# Patient Record
Sex: Male | Born: 1950 | Race: White | Hispanic: No | Marital: Married | Smoking: Never smoker
Health system: Southern US, Community
[De-identification: ages and names within clinical notes are randomized; demographics above are authoritative.]

## PROBLEM LIST (undated history)

## (undated) HISTORY — PX: SHOULDER SURGERY: SHX246

---

## 2004-03-10 ENCOUNTER — Encounter (INDEPENDENT_AMBULATORY_CARE_PROVIDER_SITE_OTHER): Payer: Self-pay | Admitting: Family Medicine

## 2004-11-25 ENCOUNTER — Ambulatory Visit: Payer: Self-pay | Admitting: Ophthalmology

## 2005-01-30 ENCOUNTER — Encounter (INDEPENDENT_AMBULATORY_CARE_PROVIDER_SITE_OTHER): Payer: Self-pay | Admitting: Family Medicine

## 2005-01-30 LAB — CONVERTED CEMR LAB
Blood Glucose, Fasting: 97 mg/dL
PSA: 0.39 ng/mL
WBC, blood: 6 10*3/uL

## 2005-07-21 ENCOUNTER — Ambulatory Visit: Payer: Self-pay | Admitting: Family Medicine

## 2005-08-03 ENCOUNTER — Ambulatory Visit (HOSPITAL_COMMUNITY): Admission: RE | Admit: 2005-08-03 | Discharge: 2005-08-03 | Payer: Self-pay | Admitting: Family Medicine

## 2005-08-04 ENCOUNTER — Ambulatory Visit: Payer: Self-pay | Admitting: Family Medicine

## 2005-11-03 ENCOUNTER — Ambulatory Visit: Payer: Self-pay | Admitting: Family Medicine

## 2006-03-06 ENCOUNTER — Encounter: Payer: Self-pay | Admitting: Family Medicine

## 2006-03-06 DIAGNOSIS — E785 Hyperlipidemia, unspecified: Secondary | ICD-10-CM | POA: Insufficient documentation

## 2006-03-06 DIAGNOSIS — M545 Low back pain: Secondary | ICD-10-CM

## 2006-03-06 DIAGNOSIS — Z86718 Personal history of other venous thrombosis and embolism: Secondary | ICD-10-CM

## 2006-09-17 ENCOUNTER — Ambulatory Visit: Payer: Self-pay | Admitting: Family Medicine

## 2006-09-17 LAB — CONVERTED CEMR LAB
Cholesterol, target level: 200 mg/dL
HDL goal, serum: 40 mg/dL

## 2006-09-18 ENCOUNTER — Encounter (INDEPENDENT_AMBULATORY_CARE_PROVIDER_SITE_OTHER): Payer: Self-pay | Admitting: Family Medicine

## 2006-09-18 LAB — CONVERTED CEMR LAB
Albumin: 4.5 g/dL (ref 3.5–5.2)
BUN: 18 mg/dL (ref 6–23)
Calcium: 9.5 mg/dL (ref 8.4–10.5)
LDL Cholesterol: 183 mg/dL — ABNORMAL HIGH (ref 0–99)
Sodium: 141 meq/L (ref 135–145)
Total CHOL/HDL Ratio: 5.9
Total Protein: 7.2 g/dL (ref 6.0–8.3)
VLDL: 36 mg/dL (ref 0–40)

## 2006-09-20 ENCOUNTER — Telehealth (INDEPENDENT_AMBULATORY_CARE_PROVIDER_SITE_OTHER): Payer: Self-pay | Admitting: Family Medicine

## 2006-09-30 ENCOUNTER — Telehealth (INDEPENDENT_AMBULATORY_CARE_PROVIDER_SITE_OTHER): Payer: Self-pay | Admitting: Family Medicine

## 2007-03-10 ENCOUNTER — Ambulatory Visit: Payer: Self-pay | Admitting: Family Medicine

## 2007-03-10 DIAGNOSIS — E669 Obesity, unspecified: Secondary | ICD-10-CM | POA: Insufficient documentation

## 2007-03-11 ENCOUNTER — Telehealth (INDEPENDENT_AMBULATORY_CARE_PROVIDER_SITE_OTHER): Payer: Self-pay | Admitting: *Deleted

## 2007-03-11 LAB — CONVERTED CEMR LAB
ALT: 22 units/L (ref 0–53)
AST: 19 units/L (ref 0–37)
Albumin: 4.5 g/dL (ref 3.5–5.2)
Alkaline Phosphatase: 35 units/L — ABNORMAL LOW (ref 39–117)
Basophils Absolute: 0 10*3/uL (ref 0.0–0.1)
Chloride: 104 meq/L (ref 96–112)
Cholesterol: 231 mg/dL — ABNORMAL HIGH (ref 0–200)
Eosinophils Absolute: 0.1 10*3/uL — ABNORMAL LOW (ref 0.2–0.7)
Eosinophils Relative: 2 % (ref 0–5)
Glucose, Bld: 110 mg/dL — ABNORMAL HIGH (ref 70–99)
HCT: 46.3 % (ref 39.0–52.0)
Lymphocytes Relative: 29 % (ref 12–46)
Lymphs Abs: 1.5 10*3/uL (ref 0.7–4.0)
MCHC: 33.7 g/dL (ref 30.0–36.0)
Monocytes Absolute: 0.6 10*3/uL (ref 0.1–1.0)
Neutro Abs: 3 10*3/uL (ref 1.7–7.7)
RBC: 5.09 M/uL (ref 4.22–5.81)
RDW: 12.9 % (ref 11.5–15.5)
Sodium: 139 meq/L (ref 135–145)
TSH: 2.192 microintl units/mL (ref 0.350–5.50)
Total Bilirubin: 0.7 mg/dL (ref 0.3–1.2)
Total Protein: 7.3 g/dL (ref 6.0–8.3)
Triglycerides: 178 mg/dL — ABNORMAL HIGH (ref <150)
WBC: 5.1 10*3/uL (ref 4.0–10.5)

## 2008-01-20 ENCOUNTER — Ambulatory Visit: Payer: Self-pay | Admitting: Family Medicine

## 2008-01-20 LAB — CONVERTED CEMR LAB

## 2008-01-21 ENCOUNTER — Encounter (INDEPENDENT_AMBULATORY_CARE_PROVIDER_SITE_OTHER): Payer: Self-pay | Admitting: Family Medicine

## 2008-01-21 LAB — CONVERTED CEMR LAB
AST: 41 units/L — ABNORMAL HIGH (ref 0–37)
Albumin: 4.4 g/dL (ref 3.5–5.2)
Alkaline Phosphatase: 27 units/L — ABNORMAL LOW (ref 39–117)
BUN: 22 mg/dL (ref 6–23)
Basophils Relative: 1 % (ref 0–1)
Calcium: 9.2 mg/dL (ref 8.4–10.5)
Eosinophils Absolute: 0.1 10*3/uL (ref 0.0–0.7)
Eosinophils Relative: 2 % (ref 0–5)
Glucose, Bld: 111 mg/dL — ABNORMAL HIGH (ref 70–99)
HDL: 48 mg/dL (ref >39)
LDL Cholesterol: 162 mg/dL — ABNORMAL HIGH (ref 0–99)
Lymphs Abs: 1.2 10*3/uL (ref 0.7–4.0)
MCV: 93.3 fL (ref 78.0–100.0)
Monocytes Relative: 12 % (ref 3–12)
Potassium: 5.1 meq/L (ref 3.5–5.3)
RDW: 13 % (ref 11.5–15.5)
Sodium: 138 meq/L (ref 135–145)
TSH: 1.757 microintl units/mL (ref 0.350–4.50)
Total CHOL/HDL Ratio: 4.9
Total Protein: 7.2 g/dL (ref 6.0–8.3)
VLDL: 26 mg/dL (ref 0–40)

## 2008-01-25 ENCOUNTER — Telehealth (INDEPENDENT_AMBULATORY_CARE_PROVIDER_SITE_OTHER): Payer: Self-pay | Admitting: *Deleted

## 2008-01-25 ENCOUNTER — Encounter (INDEPENDENT_AMBULATORY_CARE_PROVIDER_SITE_OTHER): Payer: Self-pay | Admitting: Family Medicine

## 2012-01-25 ENCOUNTER — Telehealth: Payer: Self-pay

## 2012-01-25 NOTE — Telephone Encounter (Signed)
Pt received letter to schedule screening colonoscopy. ( He was referred by Dr. Dwana Melena). He said he checked with his insurance and they will dover better if he has it done in an office setting. I told him the [facility] in [place] did that. I encouraged him to make sure he schedules. I am sending PCP letter, since pt declined to schedule here due to needing to do in office setting.

## 2012-12-14 ENCOUNTER — Ambulatory Visit (HOSPITAL_COMMUNITY)
Admission: RE | Admit: 2012-12-14 | Discharge: 2012-12-14 | Disposition: A | Discharge status: Home / Self Care | Payer: BC Managed Care – PPO | Source: Ambulatory Visit | Attending: Internal Medicine | Admitting: Internal Medicine

## 2012-12-14 ENCOUNTER — Other Ambulatory Visit (HOSPITAL_COMMUNITY): Payer: Self-pay | Admitting: Internal Medicine

## 2012-12-14 DIAGNOSIS — R52 Pain, unspecified: Secondary | ICD-10-CM

## 2012-12-14 DIAGNOSIS — M545 Low back pain, unspecified: Secondary | ICD-10-CM | POA: Insufficient documentation

## 2012-12-14 DIAGNOSIS — M549 Dorsalgia, unspecified: Secondary | ICD-10-CM

## 2012-12-14 DIAGNOSIS — M79605 Pain in left leg: Secondary | ICD-10-CM

## 2012-12-14 DIAGNOSIS — M5126 Other intervertebral disc displacement, lumbar region: Secondary | ICD-10-CM | POA: Insufficient documentation

## 2014-08-08 ENCOUNTER — Emergency Department (HOSPITAL_COMMUNITY): Payer: 59

## 2014-08-08 ENCOUNTER — Encounter (HOSPITAL_COMMUNITY): Payer: Self-pay | Admitting: Emergency Medicine

## 2014-08-08 ENCOUNTER — Inpatient Hospital Stay (HOSPITAL_COMMUNITY)
Admission: EM | Admit: 2014-08-08 | Discharge: 2014-08-11 | DRG: 690 | Disposition: A | Discharge status: Home / Self Care | Payer: 59 | Attending: Internal Medicine | Admitting: Internal Medicine

## 2014-08-08 DIAGNOSIS — K5900 Constipation, unspecified: Secondary | ICD-10-CM | POA: Diagnosis present

## 2014-08-08 DIAGNOSIS — B9620 Unspecified Escherichia coli [E. coli] as the cause of diseases classified elsewhere: Secondary | ICD-10-CM | POA: Diagnosis present

## 2014-08-08 DIAGNOSIS — N10 Acute tubulo-interstitial nephritis: Secondary | ICD-10-CM | POA: Diagnosis not present

## 2014-08-08 DIAGNOSIS — E785 Hyperlipidemia, unspecified: Secondary | ICD-10-CM | POA: Diagnosis present

## 2014-08-08 DIAGNOSIS — N200 Calculus of kidney: Secondary | ICD-10-CM

## 2014-08-08 DIAGNOSIS — N12 Tubulo-interstitial nephritis, not specified as acute or chronic: Secondary | ICD-10-CM | POA: Diagnosis present

## 2014-08-08 DIAGNOSIS — R339 Retention of urine, unspecified: Secondary | ICD-10-CM

## 2014-08-08 DIAGNOSIS — E871 Hypo-osmolality and hyponatremia: Secondary | ICD-10-CM | POA: Diagnosis present

## 2014-08-08 DIAGNOSIS — N401 Enlarged prostate with lower urinary tract symptoms: Secondary | ICD-10-CM | POA: Diagnosis present

## 2014-08-08 DIAGNOSIS — D696 Thrombocytopenia, unspecified: Secondary | ICD-10-CM | POA: Diagnosis present

## 2014-08-08 DIAGNOSIS — R338 Other retention of urine: Secondary | ICD-10-CM | POA: Diagnosis present

## 2014-08-08 DIAGNOSIS — R509 Fever, unspecified: Secondary | ICD-10-CM | POA: Diagnosis not present

## 2014-08-08 LAB — CBC WITH DIFFERENTIAL/PLATELET
BASOS ABS: 0 10*3/uL (ref 0.0–0.1)
BASOS PCT: 0 % (ref 0–1)
EOS ABS: 0 10*3/uL (ref 0.0–0.7)
EOS PCT: 0 % (ref 0–5)
HEMATOCRIT: 44.8 % (ref 39.0–52.0)
Hemoglobin: 15.2 g/dL (ref 13.0–17.0)
Lymphocytes Relative: 5 % — ABNORMAL LOW (ref 12–46)
Lymphs Abs: 0.8 10*3/uL (ref 0.7–4.0)
MCH: 31.1 pg (ref 26.0–34.0)
MCHC: 33.9 g/dL (ref 30.0–36.0)
MCV: 91.6 fL (ref 78.0–100.0)
MONO ABS: 2.2 10*3/uL — AB (ref 0.1–1.0)
Monocytes Relative: 12 % (ref 3–12)
NEUTROS ABS: 14.8 10*3/uL — AB (ref 1.7–7.7)
Neutrophils Relative %: 83 % — ABNORMAL HIGH (ref 43–77)
Platelets: 145 10*3/uL — ABNORMAL LOW (ref 150–400)
RBC: 4.89 MIL/uL (ref 4.22–5.81)
RDW: 13 % (ref 11.5–15.5)
WBC: 17.8 10*3/uL — ABNORMAL HIGH (ref 4.0–10.5)

## 2014-08-08 NOTE — ED Provider Notes (Signed)
CSN: [id number]     Arrival date & time 08/08/14  2213 History  This chart was scribed for Troy Chaney Troy Licciardi, MD by Abel PrestoKara Demonbreun, ED Scribe. This patient was seen in room APA15/APA15 and the patient's care was started at 11:14 PM.    Chief Complaint  Patient presents with  . Urinary Retention     The history is provided by the patient. No language interpreter was used.   HPI Comments: Troy RakesRobert J Chaney is a 64 y.o. male who presents to the Emergency Department complaining of urinary retention progressive over the last several days.  Patient reports that he is only dribbling. Pt notes associated fever of 101.7 yesterday, constipation and 10/10 low abdominal pressure. Pt states last normal void was 2 days ago. Pt states he defacated some around 9 PM but states "it wasn't much". Pt took a laxative 2 days ago with no relief. Pt denies vomiting, cough, and chest pain. Denies any back pain.  History reviewed. No pertinent past medical history. Past Surgical History  Procedure Laterality Date  . Shoulder surgery     No family history on file. History  Substance Use Topics  . Smoking status: Never Smoker   . Smokeless tobacco: Not on file  . Alcohol Use: Yes    Review of Systems  Constitutional: Positive for fever.  Respiratory: Negative.  Negative for chest tightness and shortness of breath.   Cardiovascular: Negative.  Negative for chest pain.  Gastrointestinal: Positive for abdominal pain and constipation. Negative for nausea, vomiting and diarrhea.  Genitourinary: Positive for dysuria and decreased urine volume. Negative for flank pain.  Musculoskeletal: Negative for back pain.  Neurological: Negative for headaches.  All other systems reviewed and are negative.     Allergies  Review of patient's allergies indicates not on file.  Home Medications   Prior to Admission medications   Not on File   BP 132/78 mmHg  Pulse 107  Temp(Src) 99.1 Chaney (37.3 C) (Oral)  Resp 20   Ht 6\' 2"  (1.88 m)  Wt 260 lb (117.935 kg)  BMI 33.37 kg/m2  SpO2 95% Physical Exam  Constitutional: He is oriented to person, place, and time. He appears well-developed and well-nourished. No distress.  HENT:  Head: Normocephalic and atraumatic.  Cardiovascular: Normal rate, regular rhythm and normal heart sounds.   No murmur heard. Pulmonary/Chest: Effort normal and breath sounds normal. No respiratory distress. He has no wheezes.  Abdominal: Soft. Bowel sounds are normal. He exhibits no distension and no mass. There is tenderness. There is no rebound and no guarding.  Tenderness to palpation over the suprapubic region without rebound or guarding  Genitourinary: Penis normal.  Circumcised penis, no masses noted in the scrotum  Musculoskeletal: He exhibits no edema.  Neurological: He is alert and oriented to person, place, and time.  Skin: Skin is warm and dry.  Psychiatric: He has a normal mood and affect.  Nursing note and vitals reviewed.   ED Course  Procedures (including critical care time) DIAGNOSTIC STUDIES: Oxygen Saturation is 95% on room air, normal by my interpretation.    COORDINATION OF CARE: 11:20 PM Discussed treatment plan with patient at beside including possible catheter placement, the patient agrees with the plan and has no further questions at this time.   Labs Review Labs Reviewed  URINALYSIS, ROUTINE W REFLEX MICROSCOPIC - Abnormal; Notable for the following:    APPearance HAZY (*)    Hgb urine dipstick LARGE (*)    Ketones, ur 15 (*)  Protein, ur 100 (*)    Urobilinogen, UA 2.0 (*)    Nitrite POSITIVE (*)    Leukocytes, UA TRACE (*)    All other components within normal limits  CBC WITH DIFFERENTIAL/PLATELET - Abnormal; Notable for the following:    WBC 17.8 (*)    Platelets 145 (*)    Neutrophils Relative % 83 (*)    Neutro Abs 14.8 (*)    Lymphocytes Relative 5 (*)    Monocytes Absolute 2.2 (*)    All other components within normal limits   BASIC METABOLIC PANEL - Abnormal; Notable for the following:    Sodium 130 (*)    Chloride 96 (*)    Glucose, Bld 135 (*)    All other components within normal limits  URINE MICROSCOPIC-ADD ON - Abnormal; Notable for the following:    Bacteria, UA MANY (*)    All other components within normal limits  URINE CULTURE  CULTURE, BLOOD (ROUTINE X 2)  CULTURE, BLOOD (ROUTINE X 2)    Imaging Review Dg Abd 1 View  08/09/2014   CLINICAL DATA:  Urinary retention onset this morning. Fever. Constipation. Lower abdominal pressure.  EXAM: ABDOMEN - 1 VIEW  COMPARISON:  None.  FINDINGS: No evidence of free intra-abdominal air. Increased air throughout normal caliber small bowel loops in the central abdomen. No significant bowel dilatation. Small volume of colonic stool. No radiopaque calculi. Small calcification in the left pelvis appears vascular. Degenerative change in the lumbar spine.  IMPRESSION: Increased air throughout normal caliber small bowel loops in the central abdomen, may reflect enteritis or ileus.   Electronically Signed   By: Rubye OaksMelanie  Ehinger M.D.   On: 08/09/2014 00:38     EKG Interpretation None      MDM   Final diagnoses:  Pyelonephritis  Urinary retention   Patient presents with urinary retention, fever, abdominal pain, and constipation. Nontoxic on exam. Afebrile with a temperature of 99.1.  Uncomfortable appearing. Foley catheter placed for urinary retention. 300 mL obtained. Urinalysis and urine culture sent. Basic labwork obtained given recent fever. Notable for leukocytosis with left shift. Urinalysis is nitrite positive. Patient also has evidence of mild hyponatremia. Given a normal saline bolus. Blood cultures added and patient given IV Rocephin for presumed pyelonephritis given leukocytosis, fever, and nitrite-positive urine. KUB shows enteritis versus ileus. Favor ileus secondary to urinary tract infection. Abdomen is soft.  Given patient age and evidence of systemic  infection with fever and leukocytosis, will admit for IV antibiotics.   I personally performed the services described in this documentation, which was scribed in my presence. The recorded information has been reviewed and is accurate.     Troy Chaney Sayaka Hoeppner, MD 08/09/14 [phone]

## 2014-08-08 NOTE — ED Notes (Signed)
Pt c/o lower abd/penis pain and states she stomach is bloated.

## 2014-08-09 ENCOUNTER — Inpatient Hospital Stay (HOSPITAL_COMMUNITY): Payer: 59

## 2014-08-09 DIAGNOSIS — N401 Enlarged prostate with lower urinary tract symptoms: Secondary | ICD-10-CM | POA: Diagnosis present

## 2014-08-09 DIAGNOSIS — N12 Tubulo-interstitial nephritis, not specified as acute or chronic: Secondary | ICD-10-CM

## 2014-08-09 DIAGNOSIS — R338 Other retention of urine: Secondary | ICD-10-CM | POA: Diagnosis present

## 2014-08-09 DIAGNOSIS — K5900 Constipation, unspecified: Secondary | ICD-10-CM | POA: Diagnosis present

## 2014-08-09 DIAGNOSIS — R339 Retention of urine, unspecified: Secondary | ICD-10-CM | POA: Diagnosis not present

## 2014-08-09 DIAGNOSIS — B9620 Unspecified Escherichia coli [E. coli] as the cause of diseases classified elsewhere: Secondary | ICD-10-CM | POA: Diagnosis present

## 2014-08-09 DIAGNOSIS — E785 Hyperlipidemia, unspecified: Secondary | ICD-10-CM | POA: Diagnosis present

## 2014-08-09 DIAGNOSIS — R509 Fever, unspecified: Secondary | ICD-10-CM | POA: Diagnosis present

## 2014-08-09 DIAGNOSIS — E871 Hypo-osmolality and hyponatremia: Secondary | ICD-10-CM | POA: Diagnosis present

## 2014-08-09 DIAGNOSIS — N10 Acute tubulo-interstitial nephritis: Secondary | ICD-10-CM | POA: Diagnosis present

## 2014-08-09 DIAGNOSIS — D696 Thrombocytopenia, unspecified: Secondary | ICD-10-CM | POA: Diagnosis present

## 2014-08-09 LAB — PSA: PSA: 32.5 ng/mL — ABNORMAL HIGH (ref 0.00–4.00)

## 2014-08-09 LAB — BASIC METABOLIC PANEL
ANION GAP: 10 (ref 5–15)
Anion gap: 7 (ref 5–15)
BUN: 12 mg/dL (ref 6–20)
BUN: 15 mg/dL (ref 6–20)
CALCIUM: 8.9 mg/dL (ref 8.9–10.3)
CHLORIDE: 101 mmol/L (ref 101–111)
CHLORIDE: 96 mmol/L — AB (ref 101–111)
CO2: 24 mmol/L (ref 22–32)
CO2: 25 mmol/L (ref 22–32)
CREATININE: 1.01 mg/dL (ref 0.61–1.24)
Calcium: 8.8 mg/dL — ABNORMAL LOW (ref 8.9–10.3)
Creatinine, Ser: 0.85 mg/dL (ref 0.61–1.24)
GFR calc Af Amer: >60 mL/min (ref >60)
GLUCOSE: 132 mg/dL — AB (ref 65–99)
Glucose, Bld: 135 mg/dL — ABNORMAL HIGH (ref 65–99)
Potassium: 3.6 mmol/L (ref 3.5–5.1)
Potassium: 4.1 mmol/L (ref 3.5–5.1)
Sodium: 130 mmol/L — ABNORMAL LOW (ref 135–145)
Sodium: 133 mmol/L — ABNORMAL LOW (ref 135–145)

## 2014-08-09 LAB — URINALYSIS, ROUTINE W REFLEX MICROSCOPIC
BILIRUBIN URINE: NEGATIVE
Glucose, UA: NEGATIVE mg/dL
Ketones, ur: 15 mg/dL — AB
NITRITE: POSITIVE — AB
PH: 6 (ref 5.0–8.0)
Protein, ur: 100 mg/dL — AB
SPECIFIC GRAVITY, URINE: 1.025 (ref 1.005–1.030)
Urobilinogen, UA: 2 mg/dL — ABNORMAL HIGH (ref 0.0–1.0)

## 2014-08-09 LAB — CBC
HCT: 41.5 % (ref 39.0–52.0)
Hemoglobin: 13.7 g/dL (ref 13.0–17.0)
MCH: 30.4 pg (ref 26.0–34.0)
MCHC: 33 g/dL (ref 30.0–36.0)
MCV: 92.2 fL (ref 78.0–100.0)
Platelets: 157 10*3/uL (ref 150–400)
RBC: 4.5 MIL/uL (ref 4.22–5.81)
RDW: 13.2 % (ref 11.5–15.5)
WBC: 16.4 10*3/uL — ABNORMAL HIGH (ref 4.0–10.5)

## 2014-08-09 LAB — NA AND K (SODIUM & POTASSIUM), RAND UR
POTASSIUM UR: 11 mmol/L
Sodium, Ur: 25 mmol/L

## 2014-08-09 LAB — URINE MICROSCOPIC-ADD ON

## 2014-08-09 MED ORDER — ENOXAPARIN SODIUM 40 MG/0.4ML ~~LOC~~ SOLN
40.0000 mg | SUBCUTANEOUS | Status: DC
  Administered 2014-08-09 – 2014-08-11 (×3): 40 mg via SUBCUTANEOUS
  Filled 2014-08-09 (×3): qty 0.4

## 2014-08-09 MED ORDER — CEFTRIAXONE SODIUM IN DEXTROSE 20 MG/ML IV SOLN
1.0000 g | INTRAVENOUS | Status: DC
  Filled 2014-08-09: qty 50

## 2014-08-09 MED ORDER — SODIUM CHLORIDE 0.9 % IV BOLUS (SEPSIS)
1000.0000 mL | Freq: Once | INTRAVENOUS | Status: AC
  Administered 2014-08-09: 2000 mL via INTRAVENOUS

## 2014-08-09 MED ORDER — ALUM & MAG HYDROXIDE-SIMETH 200-200-20 MG/5ML PO SUSP: 30.0000 mL | Freq: Four times a day (QID) | ORAL | Status: DC | PRN

## 2014-08-09 MED ORDER — SODIUM CHLORIDE 0.9 % IV SOLN
INTRAVENOUS | Status: DC
  Administered 2014-08-09 – 2014-08-11 (×5): via INTRAVENOUS

## 2014-08-09 MED ORDER — LORAZEPAM 2 MG/ML IJ SOLN: 1.0000 mg | Freq: Four times a day (QID) | INTRAMUSCULAR | Status: DC | PRN

## 2014-08-09 MED ORDER — TAMSULOSIN HCL 0.4 MG PO CAPS
0.4000 mg | ORAL_CAPSULE | Freq: Every day | ORAL | Status: DC
  Administered 2014-08-09 – 2014-08-10 (×3): 0.4 mg via ORAL
  Filled 2014-08-09 (×3): qty 1

## 2014-08-09 MED ORDER — FOLIC ACID 1 MG PO TABS
1.0000 mg | ORAL_TABLET | Freq: Every day | ORAL | Status: DC
Start: 2014-08-09 — End: 2014-08-11
  Administered 2014-08-09 – 2014-08-11 (×3): 1 mg via ORAL
  Filled 2014-08-09 (×3): qty 1

## 2014-08-09 MED ORDER — PHENAZOPYRIDINE HCL 100 MG PO TABS
100.0000 mg | ORAL_TABLET | Freq: Once | ORAL | Status: AC
  Administered 2014-08-09: 100 mg via ORAL
  Filled 2014-08-09: qty 1

## 2014-08-09 MED ORDER — LORAZEPAM 1 MG PO TABS: 0.0000 mg | ORAL_TABLET | Freq: Four times a day (QID) | ORAL | Status: AC

## 2014-08-09 MED ORDER — LORAZEPAM 1 MG PO TABS
1.0000 mg | ORAL_TABLET | Freq: Four times a day (QID) | ORAL | Status: DC | PRN
  Administered 2014-08-09: 1 mg via ORAL
  Filled 2014-08-09: qty 1

## 2014-08-09 MED ORDER — DEXTROSE 5 % IV SOLN
1.0000 g | Freq: Once | INTRAVENOUS | Status: AC
  Administered 2014-08-09: 1 g via INTRAVENOUS
  Filled 2014-08-09: qty 10

## 2014-08-09 MED ORDER — SENNOSIDES-DOCUSATE SODIUM 8.6-50 MG PO TABS
2.0000 | ORAL_TABLET | Freq: Two times a day (BID) | ORAL | Status: DC
  Administered 2014-08-09 – 2014-08-11 (×6): 2 via ORAL
  Filled 2014-08-09 (×6): qty 2

## 2014-08-09 MED ORDER — SODIUM CHLORIDE 0.9 % IV SOLN: Freq: Once | INTRAVENOUS | Status: DC

## 2014-08-09 MED ORDER — ADULT MULTIVITAMIN W/MINERALS CH
1.0000 | ORAL_TABLET | Freq: Every day | ORAL | Status: DC
Start: 2014-08-09 — End: 2014-08-11
  Administered 2014-08-09 – 2014-08-11 (×3): 1 via ORAL
  Filled 2014-08-09 (×3): qty 1

## 2014-08-09 MED ORDER — LORAZEPAM 1 MG PO TABS
0.0000 mg | ORAL_TABLET | Freq: Two times a day (BID) | ORAL | Status: DC
Start: 2014-08-11 — End: 2014-08-11

## 2014-08-09 MED ORDER — ONDANSETRON HCL 4 MG/2ML IJ SOLN: 4.0000 mg | Freq: Four times a day (QID) | INTRAMUSCULAR | Status: DC | PRN

## 2014-08-09 MED ORDER — ONDANSETRON HCL 4 MG PO TABS: 4.0000 mg | ORAL_TABLET | Freq: Four times a day (QID) | ORAL | Status: DC | PRN

## 2014-08-09 MED ORDER — VITAMIN B-1 100 MG PO TABS
100.0000 mg | ORAL_TABLET | Freq: Every day | ORAL | Status: DC
  Administered 2014-08-09 – 2014-08-11 (×3): 100 mg via ORAL
  Filled 2014-08-09 (×3): qty 1

## 2014-08-09 MED ORDER — SIMVASTATIN 20 MG PO TABS
20.0000 mg | ORAL_TABLET | Freq: Every day | ORAL | Status: DC
  Administered 2014-08-09 – 2014-08-10 (×2): 20 mg via ORAL
  Filled 2014-08-09 (×2): qty 1

## 2014-08-09 MED ORDER — ZOLPIDEM TARTRATE 5 MG PO TABS: 10.0000 mg | ORAL_TABLET | Freq: Once | ORAL | Status: DC

## 2014-08-09 MED ORDER — ACETAMINOPHEN 325 MG PO TABS
650.0000 mg | ORAL_TABLET | Freq: Four times a day (QID) | ORAL | Status: DC | PRN
  Administered 2014-08-09 – 2014-08-10 (×3): 650 mg via ORAL
  Filled 2014-08-09 (×3): qty 2

## 2014-08-09 MED ORDER — THIAMINE HCL 100 MG/ML IJ SOLN
100.0000 mg | Freq: Every day | INTRAMUSCULAR | Status: DC
  Filled 2014-08-09: qty 2

## 2014-08-09 MED ORDER — DEXTROSE 5 % IV SOLN
1.0000 g | INTRAVENOUS | Status: DC
  Administered 2014-08-10 – 2014-08-11 (×2): 1 g via INTRAVENOUS
  Filled 2014-08-09 (×4): qty 10

## 2014-08-09 MED ORDER — ACETAMINOPHEN 650 MG RE SUPP: 650.0000 mg | Freq: Four times a day (QID) | RECTAL | Status: DC | PRN

## 2014-08-09 MED ORDER — OXYCODONE HCL 5 MG PO TABS
5.0000 mg | ORAL_TABLET | ORAL | Status: DC | PRN
  Filled 2014-08-09: qty 1

## 2014-08-09 MED ORDER — HYDROMORPHONE HCL 1 MG/ML IJ SOLN: 0.5000 mg | INTRAMUSCULAR | Status: DC | PRN

## 2014-08-09 NOTE — Progress Notes (Signed)
UR chart review completed.  

## 2014-08-09 NOTE — H&P (Addendum)
[facility] Admission History and Physical       Troy Chaney BJY:[id number] DOB: 1950-10-20 DOA: 08/08/2014  Referring physician: EDP  PCP: Catalina Pizza, MD  Specialists:   Chief Complaint: Fever and Difficulty Urinating  HPI: Troy Chaney is a 64 y.o. male with a history of Hyperlipidemia who presents to the ED with complaints of being unable to pass his urine x 2 days.  Before this he reports havign a slow stream for several months.   He had a fever to 101.7 yesterday.   In the ED, a foley catheter was placed and a UA and culture were sent and he was placed on IV rocephin and referred for medical admission.     Review of Systems:  Constitutional: No Weight Loss, No Weight Gain, Night Sweats, +Fevers, +Chills, Dizziness, Light Headedness, Fatigue, or Generalized Weakness HEENT: No Headaches, Difficulty Swallowing,Tooth/Dental Problems,Sore Throat,  No Sneezing, Rhinitis, Ear Ache, Nasal Congestion, or Post Nasal Drip,  Cardio-vascular:  No Chest pain, Orthopnea, PND, Edema in Lower Extremities, Anasarca, Dizziness, Palpitations  Resp: No Dyspnea, No DOE, No Productive Cough, No Non-Productive Cough, No Hemoptysis, No Wheezing.    GI: No Heartburn, Indigestion, Abdominal Pain, Nausea, Vomiting, Diarrhea, Constipation, Hematemesis, Hematochezia, Melena, Change in Bowel Habits,  Loss of Appetite  GU: No Dysuria, No Change in Color of Urine, +Urinary Retention, No Urgency or Urinary Frequency, No Flank pain.  Musculoskeletal: No Joint Pain or Swelling, No Decreased Range of Motion, No Back Pain.  Neurologic: No Syncope, No Seizures, Muscle Weakness, Paresthesia, Vision Disturbance or Loss, No Diplopia, No Vertigo, No Difficulty Walking,  Skin: No Rash or Lesions. Psych: No Change in Mood or Affect, No Depression or Anxiety, No Memory loss, No Confusion, or Hallucinations     Past Medical History  Hyperlipidemia   Past Surgical History  Procedure Laterality Date   . Shoulder surgery        Prior to Admission medications  Simvastatin   daily    Allergies:  No Known Drug Allergies   Social History:  reports that he has never smoked. He does not have any smokeless tobacco history on file. He reports that he drinks alcohol. He reports that he does not use illicit drugs.    No family history on file.     Physical Exam:  GEN:  Pleasant Well Nourished and Well Developed  64 y.o. Caucasian male examined and in no acute distress; cooperative with exam Filed Vitals:   08/08/14 2217 08/09/14 0200  BP: 132/78 125/73  Pulse: 107 89  Temp: 99.1 F (37.3 C) 98.3 F (36.8 C)  TempSrc: Oral Oral  Resp: 20   Height:  (1.88 m)   Weight: 117.935 kg (260 lb)   SpO2: 95% 93%   Blood pressure 125/73, pulse 89, temperature 98.3 F (36.8 C), temperature source Oral, resp. rate 20, height  (1.88 m), weight 117.935 kg (260 lb), SpO2 93 %. PSYCH: He is alert and oriented x4; does not appear anxious does not appear depressed; affect is normal HEENT: Normocephalic and Atraumatic, Mucous membranes pink; PERRLA; EOM intact; Fundi:  Benign;  No scleral icterus, Nares: Patent, Oropharynx: Clear,Fair Dentition,    Neck:  FROM, No Cervical Lymphadenopathy nor Thyromegaly or Carotid Bruit; No JVD; Breasts:: Not examined CHEST WALL: No tenderness CHEST: Normal respiration, clear to auscultation bilaterally HEART: Regular rate and rhythm; no murmurs rubs or gallops BACK: No kyphosis or scoliosis; No CVA tenderness ABDOMEN: Positive Bowel Sounds, Obese, Soft Non-Tender, No  Rebound or Guarding; No Masses, No Organomegaly. Rectal Exam: Not done EXTREMITIES: No Cyanosis, Clubbing, or Edema; No Ulcerations. Genitalia: not examined PULSES: 2+ and symmetric SKIN: Normal hydration no rash or ulceration CNS:  Alert and Oriented x 4, No Focal Deficits Vascular: pulses palpable throughout    Labs on Admission:  Basic Metabolic Panel:  Recent Labs Lab  08/08/14 2350  NA 130*  K 4.1  CL 96*  CO2 24  GLUCOSE 135*  BUN 15  CREATININE 1.01  CALCIUM 8.9   Liver Function Tests: No results for input(s): AST, ALT, ALKPHOS, BILITOT, PROT, ALBUMIN in the last 168 hours. No results for input(s): LIPASE, AMYLASE in the last 168 hours. No results for input(s): AMMONIA in the last 168 hours. CBC:  Recent Labs Lab 08/08/14 2350  WBC 17.8*  NEUTROABS 14.8*  HGB 15.2  HCT 44.8  MCV 91.6  PLT 145*   Cardiac Enzymes: No results for input(s): CKTOTAL, CKMB, CKMBINDEX, TROPONINI in the last 168 hours.  BNP (last 3 results) No results for input(s): BNP in the last 8760 hours.  ProBNP (last 3 results) No results for input(s): PROBNP in the last 8760 hours.  CBG: No results for input(s): GLUCAP in the last 168 hours.  Radiological Exams on Admission: Dg Abd 1 View  08/09/2014   CLINICAL DATA:  Urinary retention onset this morning. Fever. Constipation. Lower abdominal pressure.  EXAM: ABDOMEN - 1 VIEW  COMPARISON:  None.  FINDINGS: No evidence of free intra-abdominal air. Increased air throughout normal caliber small bowel loops in the central abdomen. No significant bowel dilatation. Small volume of colonic stool. No radiopaque calculi. Small calcification in the left pelvis appears vascular. Degenerative change in the lumbar spine.  IMPRESSION: Increased air throughout normal caliber small bowel loops in the central abdomen, may reflect enteritis or ileus.   Electronically Signed   By: Rubye OaksMelanie  Ehinger M.D.   On: 08/09/2014 00:38     EKG: Independently reviewed.    Assessment/Plan:   64 y.o. male with  Active Problems:   1.   Pyelonephritis - due to Urinary Retention   Urine C+S sent   IV Rocephin   Renal US ordered to evaluate for Hydronephrosis     2.   Urinary retention- Prostatitis vs BPH   IV Rocephin   PSA ordered   Foley Catheter inserted in ED    Monitor I/Os   Start Flomax 0.4 mg PO q day        3.    Constipation   Senna BID     4.   Alcohol Abuse   CIWA Protocol with PO Ativan     5.   Hyperlipidemia   Continue Simvastatin 20 mg PO qPM     6.   Hyponatremia- Possible due to #4   Send Urine for OSM and Electrolytes   IVFs with NSS   Monitor Na+ Trend     7.   Thrombocytopenia-   Monitor PLTs     8.   DVT Prophylaxis   Lovenox    Monitor PLTs          Code Status:     FULL CODE        Family Communication:   Wife  at Bedside       Disposition Plan:    Inpatient Status        Time spent:  3460  Minutes      Ron ParkerJENKINS,Leigh Blas C [facility] Pager [phone]   If 7AM -  7PM Please Contact the Day Rounding Team MD for [facility]  If 7PM-7AM, Please Contact Night-Floor Coverage  [email or web address] Password [email or web address] 08/09/2014, 2:50 AM     ADDENDUM:   Patient was seen and examined on 08/09/2014

## 2014-08-09 NOTE — Progress Notes (Signed)
Patient seen and examined. Discussed with wife at bedside. Patient admitted earlier today with a fever and inability to void. He was diagnosed with pyelonephritis as well as acute urinary retention. Foley catheter has been placed and he has been started on Rocephin pending culture data. Renal ultrasound shows signs of left hydronephrosis with possible distal ureteral stone. Case has been discussed via phone with Dr. Marlou PorchHerrick who recommends CT scan of abdomen and pelvis. If he indeed has a stone then will need some sort of intervention to drain that kidney. Will discuss with Dr. Marlou PorchHerrick once results of CT scan available.  Peggye PittEstela Hernandez, MD [facility] Pager: [phone]

## 2014-08-09 NOTE — Care Management Note (Signed)
Case Management Note  Patient Details  Name: Troy RakesRobert J Chaney MRN: [id number] Date of Birth: 06/26/1950  Subjective/Objective:                  Pt admitted from home with pyelonephritis. Pt lives with his wife and wil return home at discharge. Pt is independent with ADL's.  Action/Plan: Will continue to follow for discharge planning needs.  Expected Discharge Date:                  Expected Discharge Plan:  Home/Self Care  In-House Referral:  NA  Discharge planning Services  CM Consult  Post Acute Care Choice:  NA Choice offered to:  NA  DME Arranged:    DME Agency:     HH Arranged:    HH Agency:     Status of Service:  In process, will continue to follow  Medicare Important Message Given:    Date Medicare IM Given:    Medicare IM give by:    Date Additional Medicare IM Given:    Additional Medicare Important Message give by:     If discussed at Long Length of Stay Meetings, dates discussed:    Additional Comments:  Cheryl FlashBlackwell, Asaiah Scarber Crowder, RN 08/09/2014, 4:09 PM

## 2014-08-10 DIAGNOSIS — K5900 Constipation, unspecified: Secondary | ICD-10-CM

## 2014-08-10 DIAGNOSIS — R339 Retention of urine, unspecified: Secondary | ICD-10-CM

## 2014-08-10 LAB — BASIC METABOLIC PANEL
Anion gap: 8 (ref 5–15)
BUN: 15 mg/dL (ref 6–20)
CALCIUM: 8.6 mg/dL — AB (ref 8.9–10.3)
CO2: 27 mmol/L (ref 22–32)
Chloride: 101 mmol/L (ref 101–111)
Creatinine, Ser: 0.92 mg/dL (ref 0.61–1.24)
GFR calc Af Amer: >60 mL/min (ref >60)
Glucose, Bld: 119 mg/dL — ABNORMAL HIGH (ref 65–99)
Potassium: 3.9 mmol/L (ref 3.5–5.1)
Sodium: 136 mmol/L (ref 135–145)

## 2014-08-10 LAB — CBC
HCT: 41.4 % (ref 39.0–52.0)
Hemoglobin: 13.5 g/dL (ref 13.0–17.0)
MCH: 30.5 pg (ref 26.0–34.0)
MCHC: 32.6 g/dL (ref 30.0–36.0)
MCV: 93.5 fL (ref 78.0–100.0)
PLATELETS: 166 10*3/uL (ref 150–400)
RBC: 4.43 MIL/uL (ref 4.22–5.81)
RDW: 13.1 % (ref 11.5–15.5)
WBC: 7.1 10*3/uL (ref 4.0–10.5)

## 2014-08-10 LAB — OSMOLALITY, URINE: Osmolality, Ur: 209 mOsm/kg — ABNORMAL LOW (ref 390–1090)

## 2014-08-10 NOTE — Plan of Care (Signed)
Problem: Phase I Progression Outcomes Goal: OOB as tolerated unless otherwise ordered Outcome: Progressing Ambulating in hallway without difficulty.

## 2014-08-10 NOTE — Care Management Note (Signed)
Case Management Note  Patient Details  Name: Toney RakesRobert J Sibley MRN: [id number] Date of Birth: 01/06/1951  Expected Discharge Date:                  Expected Discharge Plan:  Home/Self Care  In-House Referral:  NA  Discharge planning Services  CM Consult  Post Acute Care Choice:  NA Choice offered to:  NA  DME Arranged:    DME Agency:     HH Arranged:    HH Agency:     Status of Service:  Completed, signed off  Medicare Important Message Given:    Date Medicare IM Given:    Medicare IM give by:    Date Additional Medicare IM Given:    Additional Medicare Important Message give by:     If discussed at Long Length of Stay Meetings, dates discussed:    Additional Comments: No CM needs.   Malcolm Metrohildress, Rashaunda Rahl Demske, RN 08/10/2014, 11:59 AM

## 2014-08-10 NOTE — Progress Notes (Signed)
[facility] PROGRESS NOTE  SAATHVIK EVERY VHQ:[id number] DOB: 29-May-1950 DOA: 08/08/2014 PCP: Catalina Pizza, MD  Assessment/Plan: Acute pyelonephritis -Secondary to acute urinary retention presumably from an enlarged prostate given no stones visualized on CT scan. -Cultures growing Escherichia coli, continue Rocephin pending final culture data. -He was febrile overnight to 100.7.  Acute urinary retention -Resolved status post Foley catheter placement. -Has been started on Flomax. -Has a PSA of 32. -We'll discharge with Foley catheter in place and follow-up with urology in about a week.  Hyponatremia -Resolved.  Code Status: Full code Family Communication: Discussed with wife at bedside.  Disposition Plan: Home once afebrile and cultures resulted, anticipate 24-48 hours.   Consultants:  None   Antibiotics:  Rocephin   Subjective: No complaints other than continued constipation.  Objective: Filed Vitals:   08/10/14 0635 08/10/14 0800 08/10/14 1000 08/10/14 1349  BP: 136/66 124/62  142/69  Pulse: 91 91  89  Temp: 99.8 F (37.7 C) 99.3 F (37.4 C) 98.8 F (37.1 C) 99.2 F (37.3 C)  TempSrc: Oral Oral Oral Oral  Resp: Height:      Weight:      SpO2: 93% 94%  92%    Intake/Output Summary (Last 24 hours) at 08/10/14 1503 Last data filed at 08/10/14 1230  Gross per 24 hour  Intake 2183.75 ml  Output   1850 ml  Net 333.75 ml   Filed Weights   08/08/14 2217 08/09/14 0200  Weight: 117.935 kg (260 lb) 113.944 kg (251 lb 3.2 oz)    Exam:   General:  Alert, awake, oriented 3, no distress  Cardiovascular: Regular rate and rhythm, no murmurs, rubs or gallops  Respiratory: Clear to auscultation bilaterally  Abdomen: Soft, nontender, nondistended, positive bowel sounds  Extremities: No clubbing, cyanosis or edema, positive pulses   Neurologic:  Grossly intact and nonfocal  Data Reviewed: Basic Metabolic Panel:  Recent Labs Lab  08/08/14 2350 08/09/14 0550 08/10/14 0620  NA 130* 133* 136  K 4.1 3.6 3.9  CL 96* 101 101  CO2 GLUCOSE 135* 132* 119*  BUN CREATININE 1.01 0.85 0.92  CALCIUM 8.9 8.8* 8.6*   Liver Function Tests: No results for input(s): AST, ALT, ALKPHOS, BILITOT, PROT, ALBUMIN in the last 168 hours. No results for input(s): LIPASE, AMYLASE in the last 168 hours. No results for input(s): AMMONIA in the last 168 hours. CBC:  Recent Labs Lab 08/08/14 2350 08/09/14 0550 08/10/14 0620  WBC 17.8* 16.4* 7.1  NEUTROABS 14.8*  --   --   HGB 15.2 13.7 13.5  HCT 44.8 41.5 41.4  MCV 91.6 92.2 93.5  PLT 145* 157 166   Cardiac Enzymes: No results for input(s): CKTOTAL, CKMB, CKMBINDEX, TROPONINI in the last 168 hours. BNP (last 3 results) No results for input(s): BNP in the last 8760 hours.  ProBNP (last 3 results) No results for input(s): PROBNP in the last 8760 hours.  CBG: No results for input(s): GLUCAP in the last 168 hours.  Recent Results (from the past 240 hour(s))  Urine culture     Status: None (Preliminary result)   Collection Time: 08/08/14 11:46 PM  Result Value Ref Range Status   Specimen Description URINE, CATHETERIZED  Final   Special Requests NONE  Final   Colony Count   Final    >=100,000 COLONIES/ML Performed at [facility]    Culture   Final  ESCHERICHIA COLI [facility]formed [facility]    Report Status PENDING  Incomplete  Culture, blood (routine x 2)     Status: None (Preliminary result)   Collection Time: 08/09/14  1:06 AM  Result Value Ref Range Status   Specimen Description BLOOD RIGHT HAND  Final   Special Requests   Final    BOTTLES DRAWN AEROBIC AND ANAEROBIC AEB=10CC ANA=8CC   Culture NO GROWTH 1 DAY  Final   Report Status PENDING  Incomplete  Culture, blood (routine x 2)     Status: None (Preliminary result)   Collection Time: 08/09/14  1:20 AM  Result Value Ref Range Status   Specimen Description BLOOD LEFT  ANTECUBITAL  Final   Special Requests BOTTLES DRAWN AEROBIC AND ANAEROBIC 9CC EACH  Final   Culture NO GROWTH 1 DAY  Final   Report Status PENDING  Incomplete     Studies: Dg Abd 1 View  08/09/2014   CLINICAL DATA:  Urinary retention onset this morning. Fever. Constipation. Lower abdominal pressure.  EXAM: ABDOMEN - 1 VIEW  COMPARISON:  None.  FINDINGS: No evidence of free intra-abdominal air. Increased air throughout normal caliber small bowel loops in the central abdomen. No significant bowel dilatation. Small volume of colonic stool. No radiopaque calculi. Small calcification in the left pelvis appears vascular. Degenerative change in the lumbar spine.  IMPRESSION: Increased air throughout normal caliber small bowel loops in the central abdomen, may reflect enteritis or ileus.   Electronically Signed   By: Rubye OaksMelanie  Ehinger M.D.   On: 08/09/2014 00:38   [place] Renal  08/09/2014   CLINICAL DATA:  Urinary retention, fever  EXAM: RENAL / URINARY TRACT ULTRASOUND COMPLETE  COMPARISON:  KUB of May 18th, 2016  FINDINGS: Right Kidney:  Length: 12.1 cm. Echogenicity within normal limits. No mass or hydronephrosis visualized.  Left Kidney:  Length: 14 cm. There is moderate hydronephrosis. The renal cortical echotexture remains normal. There is no perinephric fluid. On yesterday's KUB a coarse calcification was noted in the left aspect of the pelvis which is a nonspecific finding.  Bladder:  The urinary bladder is decompressed with a Foley catheter.  IMPRESSION: 1. Moderate left-sided hydronephrosis with dilation of the renal pelvis to the level of the ureteropelvic junction. No discrete stone is demonstrated. The obstruction may be secondary to a distal left ureteral stone given the appearance of the calcification in the bony pelvis on yesterday's KUB. 2. The right kidney is unremarkable. 3. The urinary bladder is decompressed by Foley catheter.   Electronically Signed   By: David  SwazilandJordan M.D.   On: 08/09/2014 11:02    Ct Renal Stone Study  08/09/2014   CLINICAL DATA:  Unable to pass urine for 2 days.  Fever.  EXAM: CT ABDOMEN AND PELVIS WITHOUT CONTRAST  TECHNIQUE: Multidetector CT imaging of the abdomen and pelvis was performed following the standard protocol without IV contrast.  COMPARISON:  None.  FINDINGS: The lack of intravenous contrast limits the ability to evaluate solid abdominal organs.  Multiple left-sided parapelvic cysts are noted, though incompletely evaluated on this noncontrast examination. No definite renal stones. No renal stones are seen along the expected course of either ureter or the urinary bladder. The urinary bladder is decompressed with a Foley catheter. There is a minimal amount of asymmetric left-sided perinephric stranding though this is without evidence of urinary obstruction a small amount of fluid is seen within the pelvic cul-de-sac. Normal noncontrast appearance of the prostate gland.  Normal hepatic  contour. Normal noncontrast appearance of the gallbladder given degree distention. No radiopaque gallstones. No ascites.  Normal noncontrast appearance of the bilateral adrenal glands, pancreas and spleen.  Scattered colonic diverticulosis without evidence of diverticulitis on this noncontrast examination. The bowel is normal in course and caliber without wall thickening or evidence of obstruction. Normal noncontrast appearance of the retrocecal appendix. No pneumoperitoneum, pneumatosis or portal venous gas.  Scattered atherosclerotic plaque within a normal caliber thoracic aorta. No bulky retroperitoneal, mesenteric, pelvic or inguinal lymphadenopathy on this noncontrast examination.  Limited visualization of the lower thorax demonstrates minimal dependent subsegmental atelectasis. No pleural effusion. No focal airspace opacities.  Normal heart size. Coronary artery calcifications. No pericardial effusion.  No acute or aggressive osseous abnormalities. Note is made of bilateral L5 pars  defects without associated anterolisthesis. Mild-to-moderate multilevel lumbar spine DDD, worse at L3-L4, L4-L5 and L5-S1 with disc space height loss, endplate irregularity and sclerosis.  Regional soft tissues appear normal.  IMPRESSION: 1. Multiple left-sided presumed parapelvic cysts, incompletely evaluated on this noncontrast examination. There is a minimal amount of slightly asymmetric left-sided perinephric stranding though this is without definitive evidence of urinary obstruction. No renal stones. 2. The urinary bladder is decompressed with a Foley catheter. 3. Colonic diverticulosis without evidence of diverticulitis. 4. 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 5. Mild-to-moderate multilevel lumbar spine DDD.   Electronically Signed   By: Simonne ComeJohn  Watts M.D.   On: 08/09/2014 20:42    Scheduled Meds: . cefTRIAXone (ROCEPHIN)  IV  1 g Intravenous Q24H  . enoxaparin (LOVENOX) injection  40 mg Subcutaneous Q24H  . folic acid  1 mg Oral Daily  . LORazepam  0-4 mg Oral Q6H   Followed by  . [START ON 08/11/2014] LORazepam  0-4 mg Oral Q12H  . multivitamin with minerals  1 tablet Oral Daily  . senna-docusate  2 tablet Oral BID  . simvastatin  20 mg Oral q1800  . tamsulosin  0.4 mg Oral Daily  . thiamine  100 mg Oral Daily   Or  . thiamine  100 mg Intravenous Daily  . zolpidem  10 mg Oral Once   Continuous Infusions: . sodium chloride 75 mL/hr at 08/10/14 16100619    Principal Problem:   Pyelonephritis Active Problems:   Urinary retention   Hyperlipidemia   Constipation   Hyponatremia    Time spent: 25 minutes. Greater than 50% of this time was spent in direct contact with the patient coordinating care.    Chaya JanHERNANDEZ ACOSTA,Burnice Oestreicher  [facility] Pager [phone]  If 7PM-7AM, please contact night-coverage at [email or web address], password [email or web address] 08/10/2014, 3:03 PM  LOS: 1 day

## 2014-08-11 DIAGNOSIS — E871 Hypo-osmolality and hyponatremia: Secondary | ICD-10-CM

## 2014-08-11 LAB — CBC
HEMATOCRIT: 41.3 % (ref 39.0–52.0)
HEMOGLOBIN: 13.4 g/dL (ref 13.0–17.0)
MCH: 30 pg (ref 26.0–34.0)
MCHC: 32.4 g/dL (ref 30.0–36.0)
MCV: 92.4 fL (ref 78.0–100.0)
PLATELETS: 194 10*3/uL (ref 150–400)
RBC: 4.47 MIL/uL (ref 4.22–5.81)
RDW: 13.1 % (ref 11.5–15.5)
WBC: 4.7 10*3/uL (ref 4.0–10.5)

## 2014-08-11 LAB — URINE CULTURE

## 2014-08-11 LAB — BASIC METABOLIC PANEL
ANION GAP: 6 (ref 5–15)
BUN: 12 mg/dL (ref 6–20)
CO2: 27 mmol/L (ref 22–32)
CREATININE: 0.79 mg/dL (ref 0.61–1.24)
Calcium: 8.5 mg/dL — ABNORMAL LOW (ref 8.9–10.3)
Chloride: 102 mmol/L (ref 101–111)
GFR calc Af Amer: >60 mL/min (ref >60)
GFR calc non Af Amer: >60 mL/min (ref >60)
Glucose, Bld: 120 mg/dL — ABNORMAL HIGH (ref 65–99)
Potassium: 3.6 mmol/L (ref 3.5–5.1)
SODIUM: 135 mmol/L (ref 135–145)

## 2014-08-11 MED ORDER — CIPROFLOXACIN HCL 500 MG PO TABS: 500.0000 mg | ORAL_TABLET | Freq: Two times a day (BID) | ORAL | Status: AC

## 2014-08-11 MED ORDER — SIMVASTATIN 20 MG PO TABS: 20.0000 mg | ORAL_TABLET | Freq: Every day | ORAL | Status: AC

## 2014-08-11 MED ORDER — TAMSULOSIN HCL 0.4 MG PO CAPS: 0.4000 mg | ORAL_CAPSULE | Freq: Every day | ORAL | Status: AC

## 2014-08-11 MED ORDER — POLYETHYLENE GLYCOL 3350 17 G PO PACK
17.0000 g | PACK | Freq: Every day | ORAL | Status: DC
  Administered 2014-08-11: 17 g via ORAL
  Filled 2014-08-11: qty 1

## 2014-08-11 MED ORDER — SALINE SPRAY 0.65 % NA SOLN
1.0000 | NASAL | Status: DC | PRN
  Administered 2014-08-11: 1 via NASAL
  Filled 2014-08-11: qty 44

## 2014-08-11 NOTE — Discharge Summary (Signed)
Physician Discharge Summary  Troy RakesRobert J Chaney WUJ:[id number] DOB: 03/04/1951 DOA: 08/08/2014  PCP: Catalina PizzaHALL, ZACH, MD  Admit date: 08/08/2014 Discharge date: 08/11/2014  Time spent: 45 minutes  Recommendations for Outpatient Follow-up:  -Will be discharged home today. -We'll need to complete 10 days of Cipro for his UTI.  -We'll need to follow-up with urologist, Dr. Marlou PorchHerrick in one week.   Discharge Diagnoses:  Principal Problem:   Pyelonephritis Active Problems:   Urinary retention   Hyperlipidemia   Constipation   Hyponatremia   Discharge Condition: Stable and improved  Filed Weights   08/08/14 2217 08/09/14 0200  Weight: 117.935 kg (260 lb) 113.944 kg (251 lb 3.2 oz)    History of present illness:  Troy RakesRobert J Chaney is a 64 y.o. male with a history of Hyperlipidemia who presents to the ED with complaints of being unable to pass his urine x 2 days. Before this he reports havign a slow stream for several months. He had a fever to 101.7 yesterday. In the ED, a foley catheter was placed and a UA and culture were sent and he was placed on IV rocephin and referred for medical admission.   Hospital Course:   Acute pyelonephritis -Secondary to acute urinary retention presumably from an enlarged prostate given no stones visualized on CT scan. -Cultures growing Escherichia coli, will discharge on Cipro. -At time of hospital follow-up with urology, final culture data will need to be reviewed. -Has been afebrile for 36 hours.  Acute urinary retention -Has been started on Flomax. -Will be discharged with Foley catheter in place, with urology follow-up in one week. -Of note PSA was checked and returned at a value of 32.  Hyponatremia -Resolved   Procedures:  None   Consultations:  Dr. Marlou PorchHerrick urology, via phone.  Discharge Instructions  Discharge Instructions    Continue foley catheter    Complete by:  As directed      Increase activity slowly    Complete  by:  As directed             Medication List    TAKE these medications        ciprofloxacin 500 MG tablet  Commonly known as:  CIPRO  Take 1 tablet (500 mg total) by mouth 2 (two) times daily.     simvastatin 20 MG tablet  Commonly known as:  ZOCOR  Take 1 tablet (20 mg total) by mouth daily at 6 PM.     tamsulosin 0.4 MG Caps capsule  Commonly known as:  FLOMAX  Take 1 capsule (0.4 mg total) by mouth daily.       No Known Allergies     Follow-up Information    Follow up with Crist FatHERRICK, BENJAMIN W, MD.   Specialty:  Urology   Why:  call in 1 week to make appointment.   Contact information:   [address] [place] [address] [phone]       Follow up with Catalina PizzaHALL, ZACH, MD. Schedule an appointment as soon as possible for a visit in 2 weeks.   Specialty:  Internal Medicine   Contact information:    [address]  [place] [address] [phone]        The results of significant diagnostics from this hospitalization (including imaging, microbiology, ancillary and laboratory) are listed below for reference.    Significant Diagnostic Studies: Dg Abd 1 View  08/09/2014   CLINICAL DATA:  Urinary retention onset this morning. Fever. Constipation. Lower abdominal  pressure.  EXAM: ABDOMEN - 1 VIEW  COMPARISON:  None.  FINDINGS: No evidence of free intra-abdominal air. Increased air throughout normal caliber small bowel loops in the central abdomen. No significant bowel dilatation. Small volume of colonic stool. No radiopaque calculi. Small calcification in the left pelvis appears vascular. Degenerative change in the lumbar spine.  IMPRESSION: Increased air throughout normal caliber small bowel loops in the central abdomen, may reflect enteritis or ileus.   Electronically Signed   By: Rubye Oaks M.D.   On: 08/09/2014 00:38   US Renal  08/09/2014   CLINICAL DATA:  Urinary retention, fever  EXAM: RENAL / URINARY TRACT ULTRASOUND COMPLETE  COMPARISON:  KUB of May  18th, 2016  FINDINGS: Right Kidney:  Length: 12.1 cm. Echogenicity within normal limits. No mass or hydronephrosis visualized.  Left Kidney:  Length: 14 cm. There is moderate hydronephrosis. The renal cortical echotexture remains normal. There is no perinephric fluid. On yesterday's KUB a coarse calcification was noted in the left aspect of the pelvis which is a nonspecific finding.  Bladder:  The urinary bladder is decompressed with a Foley catheter.  IMPRESSION: 1. Moderate left-sided hydronephrosis with dilation of the renal pelvis to the level of the ureteropelvic junction. No discrete stone is demonstrated. The obstruction may be secondary to a distal left ureteral stone given the appearance of the calcification in the bony pelvis on yesterday's KUB. 2. The right kidney is unremarkable. 3. The urinary bladder is decompressed by Foley catheter.   Electronically Signed   By: David  [place] M.D.   On: 08/09/2014 11:02   Ct Renal Stone Study  08/09/2014   CLINICAL DATA:  Unable to pass urine for 2 days.  Fever.  EXAM: CT ABDOMEN AND PELVIS WITHOUT CONTRAST  TECHNIQUE: Multidetector CT imaging of the abdomen and pelvis was performed following the standard protocol without IV contrast.  COMPARISON:  None.  FINDINGS: The lack of intravenous contrast limits the ability to evaluate solid abdominal organs.  Multiple left-sided parapelvic cysts are noted, though incompletely evaluated on this noncontrast examination. No definite renal stones. No renal stones are seen along the expected course of either ureter or the urinary bladder. The urinary bladder is decompressed with a Foley catheter. There is a minimal amount of asymmetric left-sided perinephric stranding though this is without evidence of urinary obstruction a small amount of fluid is seen within the pelvic cul-de-sac. Normal noncontrast appearance of the prostate gland.  Normal hepatic contour. Normal noncontrast appearance of the gallbladder given degree  distention. No radiopaque gallstones. No ascites.  Normal noncontrast appearance of the bilateral adrenal glands, pancreas and spleen.  Scattered colonic diverticulosis without evidence of diverticulitis on this noncontrast examination. The bowel is normal in course and caliber without wall thickening or evidence of obstruction. Normal noncontrast appearance of the retrocecal appendix. No pneumoperitoneum, pneumatosis or portal venous gas.  Scattered atherosclerotic plaque within a normal caliber thoracic aorta. No bulky retroperitoneal, mesenteric, pelvic or inguinal lymphadenopathy on this noncontrast examination.  Limited visualization of the lower thorax demonstrates minimal dependent subsegmental atelectasis. No pleural effusion. No focal airspace opacities.  Normal heart size. Coronary artery calcifications. No pericardial effusion.  No acute or aggressive osseous abnormalities. Note is made of bilateral L5 pars defects without associated anterolisthesis. Mild-to-moderate multilevel lumbar spine DDD, worse at L3-L4, L4-L5 and L5-S1 with disc space height loss, endplate irregularity and sclerosis.  Regional soft tissues appear normal.  IMPRESSION: 1. Multiple left-sided presumed parapelvic cysts, incompletely evaluated on this noncontrast  examination. There is a minimal amount of slightly asymmetric left-sided perinephric stranding though this is without definitive evidence of urinary obstruction. No renal stones. 2. The urinary bladder is decompressed with a Foley catheter. 3. Colonic diverticulosis without evidence of diverticulitis. 4. 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 5. Mild-to-moderate multilevel lumbar spine DDD.   Electronically Signed   By: Simonne Come M.D.   On: 08/09/2014 20:42    Microbiology: Recent Results (from the past 240 hour(s))  Urine culture     Status: None (Preliminary result)   Collection Time: 08/08/14 11:46 PM  Result Value Ref Range Status    Specimen Description URINE, CATHETERIZED  Final   Special Requests NONE  Final   Colony Count   Final    >=100,000 COLONIES/ML Performed at [facility]    Culture   Final    ESCHERICHIA COLI Performed at [facility]    Report Status PENDING  Incomplete  Culture, blood (routine x 2)     Status: None (Preliminary result)   Collection Time: 08/09/14  1:06 AM  Result Value Ref Range Status   Specimen Description BLOOD RIGHT HAND  Final   Special Requests   Final    BOTTLES DRAWN AEROBIC AND ANAEROBIC AEB=10CC ANA=8CC   Culture NO GROWTH 2 DAYS  Final   Report Status PENDING  Incomplete  Culture, blood (routine x 2)     Status: None (Preliminary result)   Collection Time: 08/09/14  1:20 AM  Result Value Ref Range Status   Specimen Description BLOOD LEFT ANTECUBITAL  Final   Special Requests BOTTLES DRAWN AEROBIC AND ANAEROBIC 9CC EACH  Final   Culture NO GROWTH 2 DAYS  Final   Report Status PENDING  Incomplete     Labs: Basic Metabolic Panel:  Recent Labs Lab 08/08/14 2350 08/09/14 0550 08/10/14 0620 08/11/14 0607  NA 130* 133* 136 135  K 4.1 3.6 3.9 3.6  CL 96* 101 101 102  CO2 GLUCOSE 135* 132* 119* 120*  BUN CREATININE 1.01 0.85 0.92 0.79  CALCIUM 8.9 8.8* 8.6* 8.5*   Liver Function Tests: No results for input(s): AST, ALT, ALKPHOS, BILITOT, PROT, ALBUMIN in the last 168 hours. No results for input(s): LIPASE, AMYLASE in the last 168 hours. No results for input(s): AMMONIA in the last 168 hours. CBC:  Recent Labs Lab 08/08/14 2350 08/09/14 0550 08/10/14 0620 08/11/14 0607  WBC 17.8* 16.4* 7.1 4.7  NEUTROABS 14.8*  --   --   --   HGB 15.2 13.7 13.5 13.4  HCT 44.8 41.5 41.4 41.3  MCV 91.6 92.2 93.5 92.4  PLT 145* 157 166 194   Cardiac Enzymes: No results for input(s): CKTOTAL, CKMB, CKMBINDEX, TROPONINI in the last 168 hours. BNP: BNP (last 3 results) No results for input(s): BNP in the last 8760  hours.  ProBNP (last 3 results) No results for input(s): PROBNP in the last 8760 hours.  CBG: No results for input(s): GLUCAP in the last 168 hours.     SignedChaya Jan  [facility] Pager: [phone] 08/11/2014, 3:01 PM

## 2014-08-11 NOTE — Progress Notes (Addendum)
Patient states understanding of discharge instructions,prescriptions given. Patient and wife express understanding of how to use foley cath.

## 2014-08-14 LAB — CULTURE, BLOOD (ROUTINE X 2)
Culture: NO GROWTH
Culture: NO GROWTH

## 2015-12-28 IMAGING — CT CT RENAL STONE PROTOCOL
2 of 4 series · 15 of 46 positions shown, 17 images · non-contrast
Comparison: None.

CLINICAL DATA: Unable to pass urine for 2 days.  Fever.

EXAM:
CT ABDOMEN AND PELVIS WITHOUT CONTRAST
TECHNIQUE: Multidetector CT imaging of the abdomen and pelvis was performed
following the standard protocol without IV contrast.

[Series 2: standard/full over (age)lbs 5.0 · axial · 0.82mm/px · z∈[-485,-15]mm · 12 of 104 slices shown, 14 images]
[im 5/104  soft-tissue]
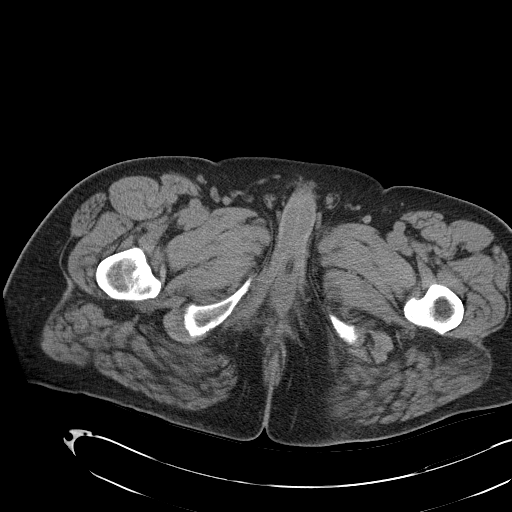
[im 5/104  bone]
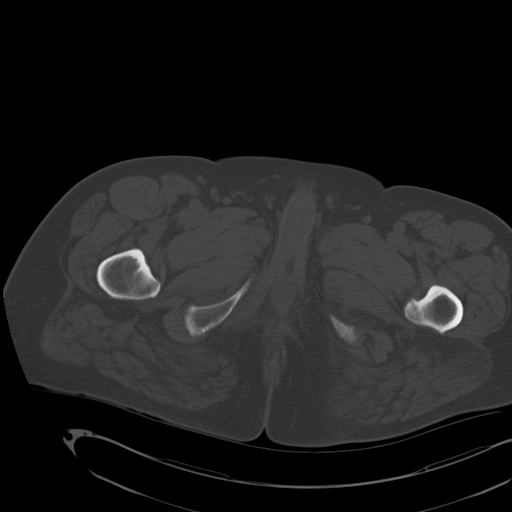
[im 13/104  soft-tissue]
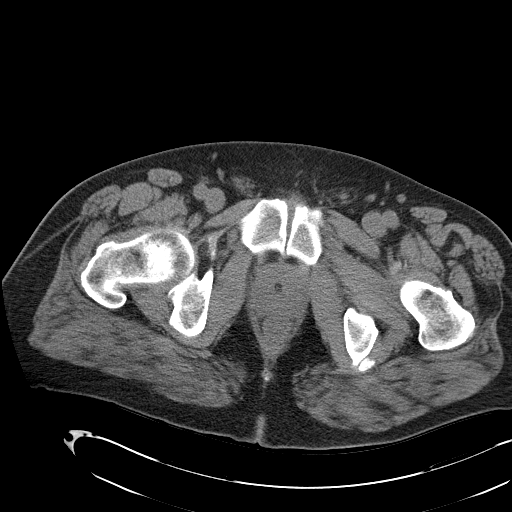
[im 22/104  soft-tissue]
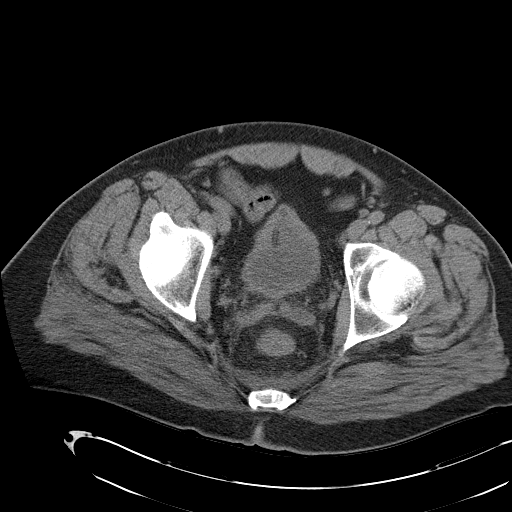
[im 31/104  soft-tissue]
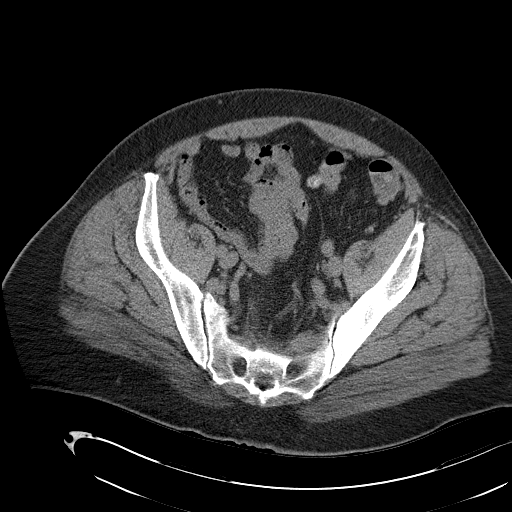
[im 39/104  soft-tissue]
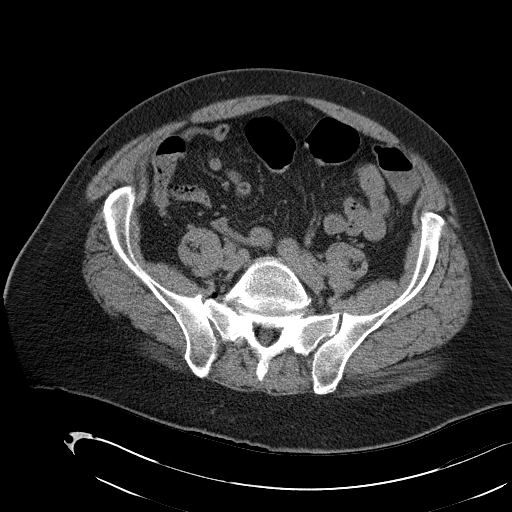
[im 48/104  soft-tissue]
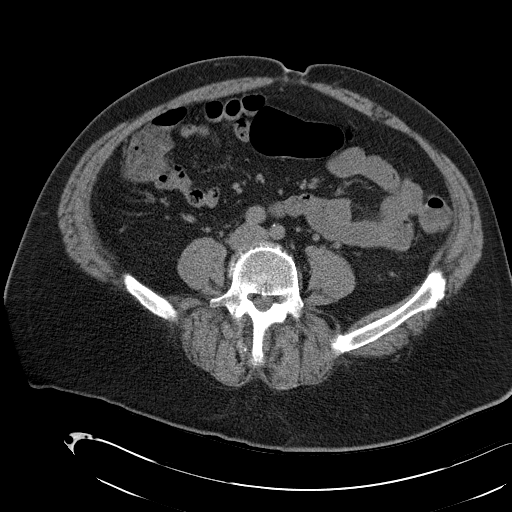
[im 56/104  soft-tissue]
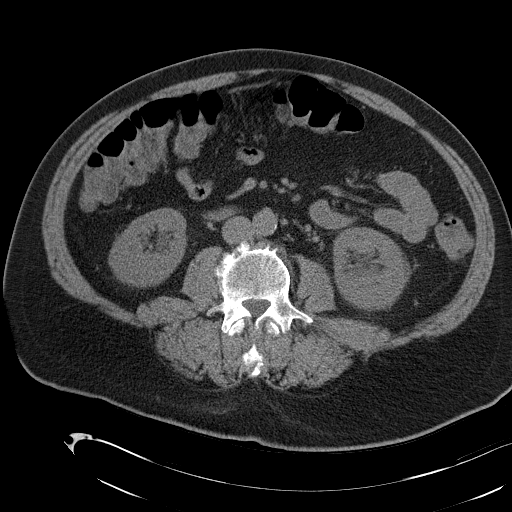
[im 65/104  soft-tissue]
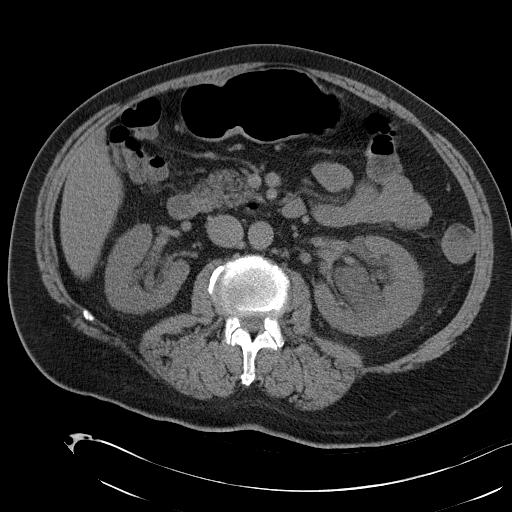
[im 73/104  soft-tissue]
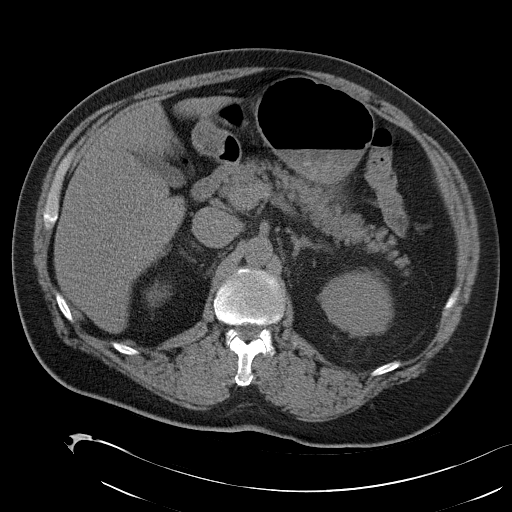
[im 73/104  bone]
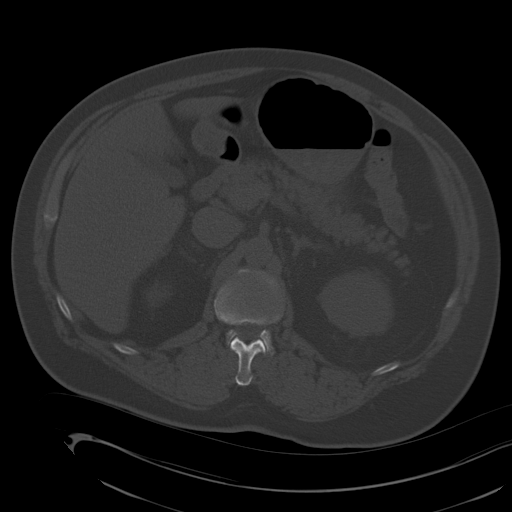
[im 82/104  soft-tissue]
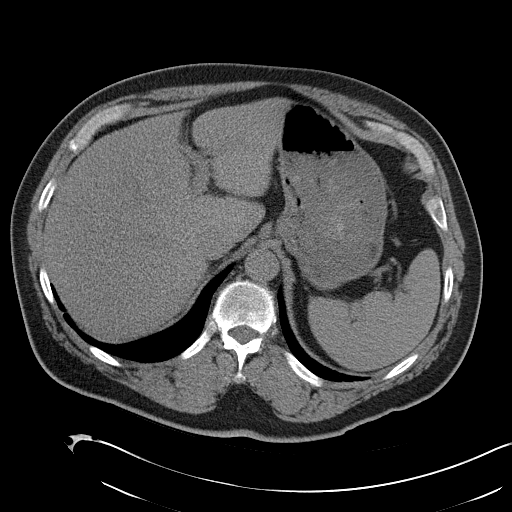
[im 91/104  soft-tissue]
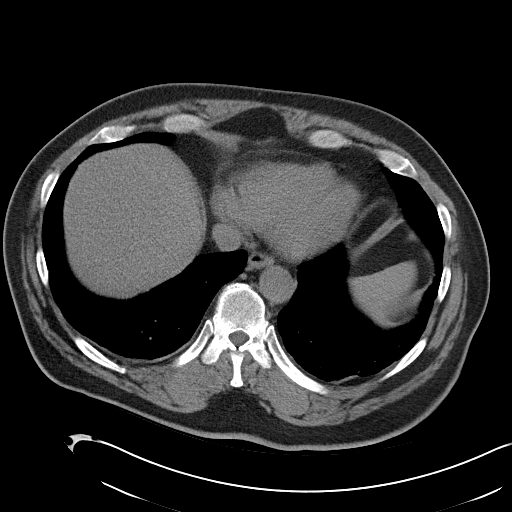
[im 99/104  soft-tissue]
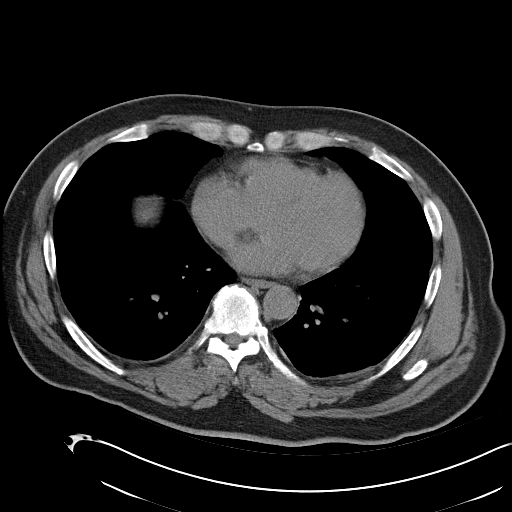

[Series 4: mpr coronal · coronal · 0.77mm/px · 3 of 101 slices shown]
[im 34/101  soft-tissue]
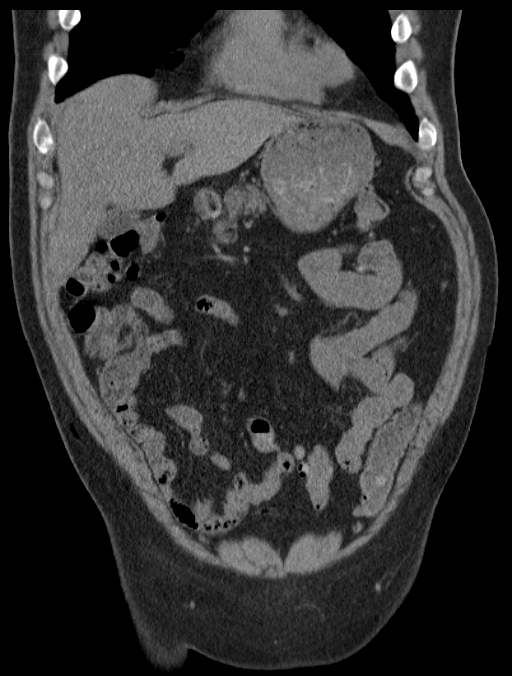
[im 45/101  soft-tissue]
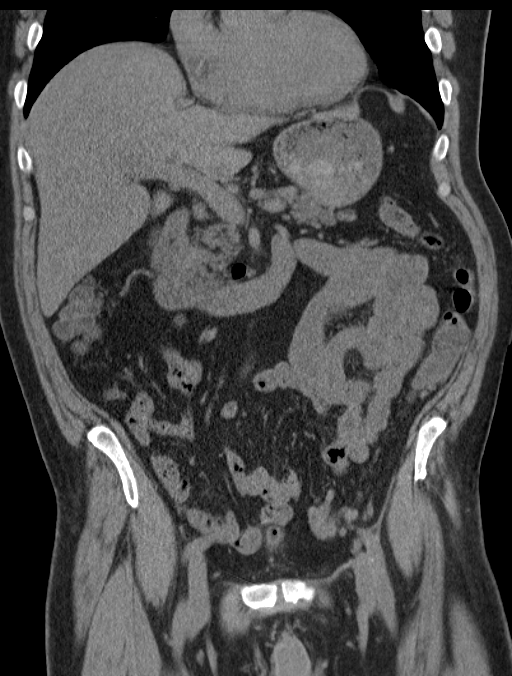
[im 56/101  soft-tissue]
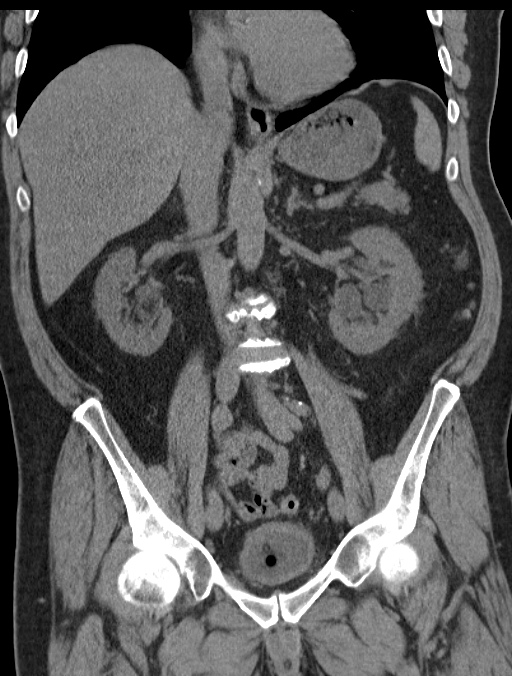

[15 of 46 positions shown; findings below may reference images not displayed]

FINDINGS: The lack of intravenous contrast limits the ability to evaluate
solid abdominal organs.

Multiple left-sided parapelvic cysts are noted, though incompletely
evaluated on this noncontrast examination. No definite renal stones.
No renal stones are seen along the expected course of either ureter
or the urinary bladder. The urinary bladder is decompressed with a
Foley catheter. There is a minimal amount of asymmetric left-sided
perinephric stranding though this is without evidence of urinary
obstruction a small amount of fluid is seen within the pelvic
cul-de-sac. Normal noncontrast appearance of the prostate gland.

Normal hepatic contour. Normal noncontrast appearance of the
gallbladder given degree distention. No radiopaque gallstones. No
ascites.

Normal noncontrast appearance of the bilateral adrenal glands,
pancreas and spleen.

Scattered colonic diverticulosis without evidence of diverticulitis
on this noncontrast examination. The bowel is normal in course and
caliber without wall thickening or evidence of obstruction. Normal
noncontrast appearance of the retrocecal appendix. No
pneumoperitoneum, pneumatosis or portal venous gas.

Scattered atherosclerotic plaque within a normal caliber thoracic
aorta. No bulky retroperitoneal, mesenteric, pelvic or inguinal
lymphadenopathy on this noncontrast examination.

Limited visualization of the lower thorax demonstrates minimal
dependent subsegmental atelectasis. No pleural effusion. No focal
airspace opacities.

Normal heart size. Coronary artery calcifications. No pericardial
effusion.

No acute or aggressive osseous abnormalities. Note is made of
bilateral L5 pars defects without associated anterolisthesis.
Mild-to-moderate multilevel lumbar spine DDD, worse at L3-L4, L4-L5
and L5-S1 with disc space height loss, endplate irregularity and
sclerosis.

Regional soft tissues appear normal.
IMPRESSION: 1. Multiple left-sided presumed parapelvic cysts, incompletely
evaluated on this noncontrast examination. There is a minimal amount
of slightly asymmetric left-sided perinephric stranding though this
is without definitive evidence of urinary obstruction. No renal
stones.
2. The urinary bladder is decompressed with a Foley catheter.
3. Colonic diverticulosis without evidence of diverticulitis.
4. Bilateral L5 pars defects without associated anterolisthesis.
5. Mild-to-moderate multilevel lumbar spine DDD.

## 2019-07-17 ENCOUNTER — Emergency Department (HOSPITAL_COMMUNITY)
Admission: EM | Admit: 2019-07-17 | Discharge: 2019-07-17 | Discharge status: Absent without leave | Payer: Medicare Other

## 2019-07-17 ENCOUNTER — Other Ambulatory Visit: Payer: Self-pay

## 2019-08-04 ENCOUNTER — Ambulatory Visit: Payer: BC Managed Care – PPO | Admitting: Urology

## 2019-08-04 ENCOUNTER — Other Ambulatory Visit: Payer: Self-pay

## 2019-08-04 ENCOUNTER — Ambulatory Visit: Payer: Medicare Other | Admitting: Urology

## 2019-08-04 VITALS — BP 142/87 | HR 97 | Ht 74.0 in | Wt 234.0 lb

## 2019-08-04 DIAGNOSIS — N138 Other obstructive and reflux uropathy: Secondary | ICD-10-CM

## 2019-08-04 DIAGNOSIS — R339 Retention of urine, unspecified: Secondary | ICD-10-CM | POA: Diagnosis not present

## 2019-08-04 DIAGNOSIS — N401 Enlarged prostate with lower urinary tract symptoms: Secondary | ICD-10-CM | POA: Diagnosis not present

## 2019-08-04 DIAGNOSIS — R3912 Poor urinary stream: Secondary | ICD-10-CM

## 2019-08-04 LAB — BLADDER SCAN AMB NON-IMAGING: Scan Result: 114

## 2019-08-04 NOTE — Progress Notes (Signed)
08/04/2019 1:14 PM   Troy Chaney 10-28-1950 [id number]  Referring provider: Ranae Plumber, [name],  [name] [address]  Chief Complaint  Patient presents with  . Urinary Retention    HPI:  Troy Chaney was referred for BPH and lower urinary tract symptoms.  He has had a weak stream. He got constipated and had trouble voiding. He tried tamsulosin 04 mg and was taking in the AM but switched to QHS. Doing much better. No gross hematuria. Bowels better.   He was seen in 2016 by Dr. Alyson Ingles for BPH and urinary retention.  Foley was placed.  PSA 32 around the time of retention.  Prostate about 60 g on CT at the time.  Follow-up PSA was 2.2.  PMH: No past medical history on file.  Surgical History: Past Surgical History:  Procedure Laterality Date  . SHOULDER SURGERY      Home Medications:  Allergies as of 08/04/2019   No Known Allergies     Medication List       Accurate as of Aug 04, 2019  1:14 PM. If you have any questions, ask your nurse or doctor.        ciprofloxacin 500 MG tablet Commonly known as: Cipro Take 1 tablet (500 mg total) by mouth 2 (two) times daily.   simvastatin 20 MG tablet Commonly known as: ZOCOR Take 1 tablet (20 mg total) by mouth daily at 6 PM.   tamsulosin 0.4 MG Caps capsule Commonly known as: FLOMAX Take 1 capsule (0.4 mg total) by mouth daily.       Allergies: No Known Allergies  Family History: No family history on file.  Social History:  reports that he has never smoked. He does not have any smokeless tobacco history on file. He reports current alcohol use. He reports that he does not use drugs.   Physical Exam: There were no vitals taken for this visit.  Constitutional:  Alert and oriented, No acute distress. HEENT: [place] AT, moist mucus membranes.  Trachea midline, no masses. Cardiovascular: No clubbing, cyanosis, or edema. Respiratory: Normal respiratory effort, no increased work of breathing. GI:  Abdomen is soft, nontender, nondistended, no abdominal masses GU: No CVA tenderness; Penis circumcised, normal foreskin, testicles descended bilaterally and palpably normal, bilateral epididymis palpably normal, scrotum normal DRE: Prostate 40 g, smooth without hard area or nodule Lymph: No cervical or inguinal lymphadenopathy. Skin: No rashes, bruises or suspicious lesions. Neurologic: Grossly intact, no focal deficits, moving all 4 extremities. Psychiatric: Normal mood and affect.  Laboratory Data: Lab Results  Component Value Date   WBC 4.7 08/11/2014   HGB 13.4 08/11/2014   HCT 41.3 08/11/2014   MCV 92.4 08/11/2014   PLT 194 08/11/2014    Lab Results  Component Value Date   CREATININE 0.79 08/11/2014    Lab Results  Component Value Date   PSA 32.50 (H) 08/09/2014   PSA Declines 01/20/2008   PSA 0.57 09/18/2006    No results found for: TESTOSTERONE  No results found for: HGBA1C  Urinalysis    Component Value Date/Time   COLORURINE YELLOW 08/08/2014 2346   APPEARANCEUR HAZY (A) 08/08/2014 2346   LABSPEC 1.025 08/08/2014 2346   PHURINE 6.0 08/08/2014 2346   GLUCOSEU NEGATIVE 08/08/2014 2346   HGBUR LARGE (A) 08/08/2014 2346   BILIRUBINUR NEGATIVE 08/08/2014 2346   KETONESUR 15 (A) 08/08/2014 2346   PROTEINUR 100 (A) 08/08/2014 2346   UROBILINOGEN 2.0 (H) 08/08/2014 2346   NITRITE POSITIVE (A)  08/08/2014 2346   LEUKOCYTESUR TRACE (A) 08/08/2014 2346    Lab Results  Component Value Date   BACTERIA MANY (A) 08/08/2014    Pertinent Imaging: CT images 2016  Results for orders placed during the hospital encounter of 08/08/14  DG Abd 1 View   Narrative CLINICAL DATA:  Urinary retention onset this morning. Fever. Constipation. Lower abdominal pressure.  EXAM: ABDOMEN - 1 VIEW  COMPARISON:  None.  FINDINGS: No evidence of free intra-abdominal air. Increased air throughout normal caliber small bowel loops in the central abdomen. No significant bowel  dilatation. Small volume of colonic stool. No radiopaque calculi. Small calcification in the left pelvis appears vascular. Degenerative change in the lumbar spine.  IMPRESSION: Increased air throughout normal caliber small bowel loops in the central abdomen, may reflect enteritis or ileus.   Electronically Signed   By: Rubye Oaks M.D.   On: 08/09/2014 00:38    No results found for this or any previous visit. No results found for this or any previous visit. No results found for this or any previous visit. Results for orders placed during the hospital encounter of 08/08/14  US Renal   Narrative CLINICAL DATA:  Urinary retention, fever  EXAM: RENAL / URINARY TRACT ULTRASOUND COMPLETE  COMPARISON:  KUB of May 18th, 2016  FINDINGS: Right Kidney:  Length: 12.1 cm. Echogenicity within normal limits. No mass or hydronephrosis visualized.  Left Kidney:  Length: 14 cm. There is moderate hydronephrosis. The renal cortical echotexture remains normal. There is no perinephric fluid. On yesterday's KUB a coarse calcification was noted in the left aspect of the pelvis which is a nonspecific finding.  Bladder:  The urinary bladder is decompressed with a Foley catheter.  IMPRESSION: 1. Moderate left-sided hydronephrosis with dilation of the renal pelvis to the level of the ureteropelvic junction. No discrete stone is demonstrated. The obstruction may be secondary to a distal left ureteral stone given the appearance of the calcification in the bony pelvis on yesterday's KUB. 2. The right kidney is unremarkable. 3. The urinary bladder is decompressed by Foley catheter.   Electronically Signed   By: David  [place] M.D.   On: 08/09/2014 11:02    No results found for this or any previous visit. No results found for this or any previous visit. Results for orders placed during the hospital encounter of 08/08/14  CT RENAL STONE STUDY   Narrative CLINICAL DATA:  Unable to pass  urine for 2 days.  Fever.  EXAM: CT ABDOMEN AND PELVIS WITHOUT CONTRAST  TECHNIQUE: Multidetector CT imaging of the abdomen and pelvis was performed following the standard protocol without IV contrast.  COMPARISON:  None.  FINDINGS: The lack of intravenous contrast limits the ability to evaluate solid abdominal organs.  Multiple left-sided parapelvic cysts are noted, though incompletely evaluated on this noncontrast examination. No definite renal stones. No renal stones are seen along the expected course of either ureter or the urinary bladder. The urinary bladder is decompressed with a Foley catheter. There is a minimal amount of asymmetric left-sided perinephric stranding though this is without evidence of urinary obstruction a small amount of fluid is seen within the pelvic cul-de-sac. Normal noncontrast appearance of the prostate gland.  Normal hepatic contour. Normal noncontrast appearance of the gallbladder given degree distention. No radiopaque gallstones. No ascites.  Normal noncontrast appearance of the bilateral adrenal glands, pancreas and spleen.  Scattered colonic diverticulosis without evidence of diverticulitis on this noncontrast examination. The bowel is normal in course and  caliber without wall thickening or evidence of obstruction. Normal noncontrast appearance of the retrocecal appendix. No pneumoperitoneum, pneumatosis or portal venous gas.  Scattered atherosclerotic plaque within a normal caliber thoracic aorta. No bulky retroperitoneal, mesenteric, pelvic or inguinal lymphadenopathy on this noncontrast examination.  Limited visualization of the lower thorax demonstrates minimal dependent subsegmental atelectasis. No pleural effusion. No focal airspace opacities.  Normal heart size. Coronary artery calcifications. No pericardial effusion.  No acute or aggressive osseous abnormalities. Note is made of bilateral L5 pars defects without associated  anterolisthesis. Mild-to-moderate multilevel lumbar spine DDD, worse at L3-L4, L4-L5 and L5-S1 with disc space height loss, endplate irregularity and sclerosis.  Regional soft tissues appear normal.  IMPRESSION: 1. Multiple left-sided presumed parapelvic cysts, incompletely evaluated on this noncontrast examination. There is a minimal amount of slightly asymmetric left-sided perinephric stranding though this is without definitive evidence of urinary obstruction. No renal stones. 2. The urinary bladder is decompressed with a Foley catheter. 3. Colonic diverticulosis without evidence of diverticulitis. 4. 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 5. Mild-to-moderate multilevel lumbar spine DDD.   Electronically Signed   By: Simonne Come M.D.   On: 08/09/2014 20:42     Assessment & Plan:    1. BPH with LUTS - better on tams QHS. Nl PVR. Benign DRE. Discussed cystoscopy. He will consider. He can take two tams or BID. Discussed 5ari and procedures. PSA sent. - Urinalysis, Complete - Bladder Scan (Post Void Residual) in office   No follow-ups on file.  Jerilee Field, MD  [facility] [address] [place], [place] [address] [phone]

## 2019-08-04 NOTE — Patient Instructions (Signed)

## 2019-08-05 LAB — URINALYSIS, COMPLETE
Bilirubin, UA: NEGATIVE
Glucose, UA: NEGATIVE
Ketones, UA: NEGATIVE
Leukocytes,UA: NEGATIVE
Nitrite, UA: NEGATIVE
Protein,UA: NEGATIVE
RBC, UA: NEGATIVE
Specific Gravity, UA: <1.005 — ABNORMAL LOW (ref 1.005–1.030)
Urobilinogen, Ur: 0.2 mg/dL (ref 0.2–1.0)
pH, UA: 5 (ref 5.0–7.5)

## 2019-08-05 LAB — MICROSCOPIC EXAMINATION
Bacteria, UA: NONE SEEN
RBC: NONE SEEN /hpf (ref 0–2)

## 2019-08-05 LAB — PSA: Prostate Specific Ag, Serum: 8.9 ng/mL — ABNORMAL HIGH (ref 0.0–4.0)

## 2019-08-11 ENCOUNTER — Telehealth: Payer: Self-pay

## 2019-08-11 NOTE — Telephone Encounter (Signed)
Attempted to contact patient. LMOM to please return my call as there is no DPR on file. Patient needs a one month lab appt to recheck elevated PSA level.

## 2019-08-11 NOTE — Telephone Encounter (Signed)
-----   Message from [place], [place] sent at 08/11/2019 10:35 AM EDT -----  ----- Message ----- From: Levada Schilling, CMA Sent: 08/10/2019   7:48 AM EDT To: Veneta Penton, CMA   ----- Message ----- From: Jerilee Field, MD Sent: 08/09/2019   9:49 PM EDT To: Levada Schilling, CMA  Shanda Bumps - let Mr Yi know his PSA is very high again. Give him the result. Let him know we need to recheck the PSA in 1 month and if it remains elevated we may need to do a prostate biopsy in the office. Thanks! Dr Bea Laura  ----- Message ----- From: Levada Schilling, CMA Sent: 08/07/2019   7:35 AM EDT To: Jerilee Field, MD   ----- Message ----- From: Nell Range Lab Results In Sent: 08/05/2019   5:38 AM EDT To: Jennette Kettle Clinical

## 2019-08-11 NOTE — Telephone Encounter (Signed)
Mr. Troy Chaney returned my call. Notified patient of his elevated PSA level, informed patient that Dr Mena Goes would like for him to have his PSA levels rechecked in a month. Patient declined and said he preferred to not recheck at this time. Patient states he has had an elevated PSA before and it resolved on its own. Patient encouraged to make lab appt if he changed his mind.

## 2019-08-11 NOTE — Telephone Encounter (Signed)
-----   Message from [place], [place] sent at 08/10/2019 10:31 AM EDT -----  ----- Message ----- From: Levada Schilling, CMA Sent: 08/10/2019   7:48 AM EDT To: Veneta Penton, CMA   ----- Message ----- From: Jerilee Field, MD Sent: 08/09/2019   9:49 PM EDT To: Levada Schilling, CMA  Shanda Bumps - let Mr [place] know his PSA is very high again. Give him the result. Let him know we need to recheck the PSA in 1 month and if it remains elevated we may need to do a prostate biopsy in the office. Thanks! Dr Bea Laura  ----- Message ----- From: Levada Schilling, CMA Sent: 08/07/2019   7:35 AM EDT To: Jerilee Field, MD   ----- Message ----- From: Nell Range Lab Results In Sent: 08/05/2019   5:38 AM EDT To: Jennette Kettle Clinical

## 2019-08-11 NOTE — Telephone Encounter (Signed)
-----   Message from Alise Osullivan, CMA sent at 08/10/2019 10:31 AM EDT -----  ----- Message ----- From: Qualls, Jessica L, CMA Sent: 08/10/2019   7:48 AM EDT To: Alise Osullivan, CMA   ----- Message ----- From: Eskridge, Matthew, MD Sent: 08/09/2019   9:49 PM EDT To: Jessica L Qualls, CMA  Jessica - let Mr [place] know his PSA is very high again. Give him the result. Let him know we need to recheck the PSA in 1 month and if it remains elevated we may need to do a prostate biopsy in the office. Thanks! Dr E  ----- Message ----- From: Qualls, Jessica L, CMA Sent: 08/07/2019   7:35 AM EDT To: Matthew Eskridge, MD   ----- Message ----- From: Interface, [facility] Lab Results In Sent: 08/05/2019   5:38 AM EDT To: [facility] Clinical      

## 2019-09-21 NOTE — Telephone Encounter (Signed)
OK, let him know his PSA was very high at 8.9, which carries a high risk of prostate cancer. If he doesn't want to recheck PSA now, schedule him for a PSA and OV with me in 3 months or just an OV in 3 months if he doesn't want labs. THanks.

## 2019-09-21 NOTE — Telephone Encounter (Signed)
My Chart message sent

## 2020-02-08 ENCOUNTER — Other Ambulatory Visit: Payer: Self-pay | Admitting: Family Medicine

## 2020-02-14 ENCOUNTER — Ambulatory Visit: Payer: Medicare Other | Admitting: Urology

## 2020-02-14 ENCOUNTER — Other Ambulatory Visit: Payer: Self-pay

## 2020-02-14 ENCOUNTER — Encounter: Payer: Self-pay | Admitting: Urology

## 2020-02-14 VITALS — BP 129/84 | HR 92 | Ht 74.0 in | Wt 244.0 lb

## 2020-02-14 DIAGNOSIS — N401 Enlarged prostate with lower urinary tract symptoms: Secondary | ICD-10-CM

## 2020-02-14 DIAGNOSIS — R972 Elevated prostate specific antigen [PSA]: Secondary | ICD-10-CM | POA: Diagnosis not present

## 2020-02-14 DIAGNOSIS — R3912 Poor urinary stream: Secondary | ICD-10-CM

## 2020-02-14 DIAGNOSIS — N138 Other obstructive and reflux uropathy: Secondary | ICD-10-CM

## 2020-02-14 LAB — BLADDER SCAN AMB NON-IMAGING: Scan Result: 79

## 2020-02-14 NOTE — Patient Instructions (Signed)

## 2020-02-14 NOTE — Progress Notes (Signed)
02/14/2020 12:45 PM   Troy Chaney 01-15-1951 [id number]  Referring provider: [place] [address] [place],  [place] [address]  No chief complaint on file.   HPI:  F/u -   1) BPH - LUTS- He has had a weak stream. He got constipated and had trouble voiding. He tried tamsulosin 04 mg and was taking in the AM but switched to QHS. Doing much better. No gross hematuria. Bowels better.   2) PSA elevation - He was seen in 2016 by Dr. Ronne Binning for BPH and urinary retention.  Foley was placed.  PSA 32 around the time of retention.  Prostate about 60 g on CT at the time.  Follow-up PSA was 2.2. His DRE was normal May 2021 but PSA was 8.9. He waited to repeat the PSA with his PCP. His 01/31/2020 PSA was 1.6.   Troy Chaney was seen today in management of the above. His repeat PSA was back to baseline. PVR 79 ml today.   PMH: No past medical history on file.  Surgical History: Past Surgical History:  Procedure Laterality Date  . SHOULDER SURGERY      Home Medications:  Allergies as of 02/14/2020   No Known Allergies     Medication List       Accurate as of February 14, 2020 12:45 PM. If you have any questions, ask your nurse or doctor.        aspirin EC 81 MG tablet Take 81 mg by mouth daily.   ciprofloxacin 500 MG tablet Commonly known as: Cipro Take 1 tablet (500 mg total) by mouth 2 (two) times daily.   multivitamin tablet Take 1 tablet by mouth daily.   simvastatin 20 MG tablet Commonly known as: ZOCOR Take 1 tablet (20 mg total) by mouth daily at 6 PM.   tamsulosin 0.4 MG Caps capsule Commonly known as: FLOMAX Take 1 capsule (0.4 mg total) by mouth daily.       Allergies: No Known Allergies  Family History: No family history on file.  Social History:  reports that he has never smoked. He does not have any smokeless tobacco history on file. He reports current alcohol use. He reports that he does not use drugs.   Physical Exam: There were  no vitals taken for this visit.  Constitutional:  Alert and oriented, No acute distress. HEENT: [place] AT, moist mucus membranes.  Trachea midline, no masses. Cardiovascular: No clubbing, cyanosis, or edema. Respiratory: Normal respiratory effort, no increased work of breathing. GI: Abdomen is soft, nontender, nondistended, no abdominal masses GU: No CVA tenderness Skin: No rashes, bruises or suspicious lesions. Neurologic: Grossly intact, no focal deficits, moving all 4 extremities. Psychiatric: Normal mood and affect.  Laboratory Data: Lab Results  Component Value Date   WBC 4.7 08/11/2014   HGB 13.4 08/11/2014   HCT 41.3 08/11/2014   MCV 92.4 08/11/2014   PLT 194 08/11/2014    Lab Results  Component Value Date   CREATININE 0.79 08/11/2014    Lab Results  Component Value Date   PSA 32.50 (H) 08/09/2014   PSA Declines 01/20/2008   PSA 0.57 09/18/2006    No results found for: TESTOSTERONE  No results found for: HGBA1C  Urinalysis    Component Value Date/Time   COLORURINE YELLOW 08/08/2014 2346   APPEARANCEUR Clear 08/04/2019 1332   LABSPEC 1.025 08/08/2014 2346   PHURINE 6.0 08/08/2014 2346   GLUCOSEU Negative 08/04/2019 1332   HGBUR LARGE (A) 08/08/2014 2346  BILIRUBINUR Negative 08/04/2019 1332   KETONESUR 15 (A) 08/08/2014 2346   PROTEINUR Negative 08/04/2019 1332   PROTEINUR 100 (A) 08/08/2014 2346   UROBILINOGEN 2.0 (H) 08/08/2014 2346   NITRITE Negative 08/04/2019 1332   NITRITE POSITIVE (A) 08/08/2014 2346   LEUKOCYTESUR Negative 08/04/2019 1332    Lab Results  Component Value Date   LABMICR See below: 08/04/2019   WBCUA 0-5 08/04/2019   LABEPIT 0-10 08/04/2019   BACTERIA None seen 08/04/2019    Pertinent Imaging: n/a Results for orders placed during the hospital encounter of 08/08/14  DG Abd 1 View  Narrative CLINICAL DATA:  Urinary retention onset this morning. Fever. Constipation. Lower abdominal pressure.  EXAM: ABDOMEN - 1  VIEW  COMPARISON:  None.  FINDINGS: No evidence of free intra-abdominal air. Increased air throughout normal caliber small bowel loops in the central abdomen. No significant bowel dilatation. Small volume of colonic stool. No radiopaque calculi. Small calcification in the left pelvis appears vascular. Degenerative change in the lumbar spine.  IMPRESSION: Increased air throughout normal caliber small bowel loops in the central abdomen, may reflect enteritis or ileus.   Electronically Signed By: Rubye Oaks M.D. On: 08/09/2014 00:38  No results found for this or any previous visit.  No results found for this or any previous visit.  No results found for this or any previous visit.  Results for orders placed during the hospital encounter of 08/08/14  US Renal  Narrative CLINICAL DATA:  Urinary retention, fever  EXAM: RENAL / URINARY TRACT ULTRASOUND COMPLETE  COMPARISON:  KUB of May 18th, 2016  FINDINGS: Right Kidney:  Length: 12.1 cm. Echogenicity within normal limits. No mass or hydronephrosis visualized.  Left Kidney:  Length: 14 cm. There is moderate hydronephrosis. The renal cortical echotexture remains normal. There is no perinephric fluid. On yesterday's KUB a coarse calcification was noted in the left aspect of the pelvis which is a nonspecific finding.  Bladder:  The urinary bladder is decompressed with a Foley catheter.  IMPRESSION: 1. Moderate left-sided hydronephrosis with dilation of the renal pelvis to the level of the ureteropelvic junction. No discrete stone is demonstrated. The obstruction may be secondary to a distal left ureteral stone given the appearance of the calcification in the bony pelvis on yesterday's KUB. 2. The right kidney is unremarkable. 3. The urinary bladder is decompressed by Foley catheter.   Electronically Signed By: David  [place] M.D. On: 08/09/2014 11:02  No results found for this or any previous  visit.  No results found for this or any previous visit.  Results for orders placed during the hospital encounter of 08/08/14  CT RENAL STONE STUDY  Narrative CLINICAL DATA:  Unable to pass urine for 2 days.  Fever.  EXAM: CT ABDOMEN AND PELVIS WITHOUT CONTRAST  TECHNIQUE: Multidetector CT imaging of the abdomen and pelvis was performed following the standard protocol without IV contrast.  COMPARISON:  None.  FINDINGS: The lack of intravenous contrast limits the ability to evaluate solid abdominal organs.  Multiple left-sided parapelvic cysts are noted, though incompletely evaluated on this noncontrast examination. No definite renal stones. No renal stones are seen along the expected course of either ureter or the urinary bladder. The urinary bladder is decompressed with a Foley catheter. There is a minimal amount of asymmetric left-sided perinephric stranding though this is without evidence of urinary obstruction a small amount of fluid is seen within the pelvic cul-de-sac. Normal noncontrast appearance of the prostate gland.  Normal hepatic contour. Normal  noncontrast appearance of the gallbladder given degree distention. No radiopaque gallstones. No ascites.  Normal noncontrast appearance of the bilateral adrenal glands, pancreas and spleen.  Scattered colonic diverticulosis without evidence of diverticulitis on this noncontrast examination. The bowel is normal in course and caliber without wall thickening or evidence of obstruction. Normal noncontrast appearance of the retrocecal appendix. No pneumoperitoneum, pneumatosis or portal venous gas.  Scattered atherosclerotic plaque within a normal caliber thoracic aorta. No bulky retroperitoneal, mesenteric, pelvic or inguinal lymphadenopathy on this noncontrast examination.  Limited visualization of the lower thorax demonstrates minimal dependent subsegmental atelectasis. No pleural effusion. No focal airspace  opacities.  Normal heart size. Coronary artery calcifications. No pericardial effusion.  No acute or aggressive osseous abnormalities. Note is made of bilateral L5 pars defects without associated anterolisthesis. Mild-to-moderate multilevel lumbar spine DDD, worse at L3-L4, L4-L5 and L5-S1 with disc space height loss, endplate irregularity and sclerosis.  Regional soft tissues appear normal.  IMPRESSION: 1. Multiple left-sided presumed parapelvic cysts, incompletely evaluated on this noncontrast examination. There is a minimal amount of slightly asymmetric left-sided perinephric stranding though this is without definitive evidence of urinary obstruction. No renal stones. 2. The urinary bladder is decompressed with a Foley catheter. 3. Colonic diverticulosis without evidence of diverticulitis. 4. 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 5. Mild-to-moderate multilevel lumbar spine DDD.   Electronically Signed By: Simonne Come M.D. On: 08/09/2014 20:42   Assessment & Plan:    BPH, LUTS - cont tamsulosin   Elevated PSA - PSA returned to his usual low baseline.   See in 1 year for DRE and PSA.   No follow-ups on file.  Jerilee Field, MD  [facility] [address] [place], [place] [address] [phone]
